# Patient Record
Sex: Female | Born: 1985 | Race: White | Hispanic: No | Marital: Single | Smoking: Never smoker
Health system: Southern US, Community
[De-identification: ages and names within clinical notes are randomized; demographics above are authoritative.]

## PROBLEM LIST (undated history)

## (undated) DIAGNOSIS — M1990 Unspecified osteoarthritis, unspecified site: Secondary | ICD-10-CM

## (undated) DIAGNOSIS — G43909 Migraine, unspecified, not intractable, without status migrainosus: Secondary | ICD-10-CM

## (undated) DIAGNOSIS — T8859XA Other complications of anesthesia, initial encounter: Secondary | ICD-10-CM

## (undated) DIAGNOSIS — N6310 Unspecified lump in the right breast, unspecified quadrant: Secondary | ICD-10-CM

## (undated) DIAGNOSIS — Z8719 Personal history of other diseases of the digestive system: Secondary | ICD-10-CM

## (undated) DIAGNOSIS — T4145XA Adverse effect of unspecified anesthetic, initial encounter: Secondary | ICD-10-CM

## (undated) HISTORY — PX: WISDOM TOOTH EXTRACTION: SHX21

## (undated) HISTORY — PX: BREAST EXCISIONAL BIOPSY: SUR124

---

## 2004-12-16 ENCOUNTER — Other Ambulatory Visit: Admission: RE | Admit: 2004-12-16 | Discharge: 2004-12-16 | Payer: Self-pay | Admitting: Family Medicine

## 2006-02-11 ENCOUNTER — Emergency Department (HOSPITAL_COMMUNITY): Admission: EM | Admit: 2006-02-11 | Discharge: 2006-02-12 | Payer: Self-pay | Admitting: Emergency Medicine

## 2009-12-10 ENCOUNTER — Emergency Department (HOSPITAL_COMMUNITY): Admission: EM | Admit: 2009-12-10 | Discharge: 2009-12-10 | Payer: Self-pay | Admitting: Emergency Medicine

## 2013-12-09 ENCOUNTER — Encounter (HOSPITAL_COMMUNITY): Payer: Self-pay | Admitting: Emergency Medicine

## 2013-12-09 DIAGNOSIS — R1013 Epigastric pain: Secondary | ICD-10-CM | POA: Insufficient documentation

## 2013-12-09 DIAGNOSIS — R0789 Other chest pain: Secondary | ICD-10-CM | POA: Insufficient documentation

## 2013-12-09 DIAGNOSIS — R112 Nausea with vomiting, unspecified: Secondary | ICD-10-CM | POA: Insufficient documentation

## 2013-12-09 DIAGNOSIS — Z87448 Personal history of other diseases of urinary system: Secondary | ICD-10-CM | POA: Insufficient documentation

## 2013-12-09 DIAGNOSIS — Z3202 Encounter for pregnancy test, result negative: Secondary | ICD-10-CM | POA: Insufficient documentation

## 2013-12-09 DIAGNOSIS — Z79899 Other long term (current) drug therapy: Secondary | ICD-10-CM | POA: Insufficient documentation

## 2013-12-09 LAB — PREGNANCY, URINE: Preg Test, Ur: NEGATIVE

## 2013-12-09 NOTE — ED Notes (Signed)
The pt has had vomiting all day  And she has had epigastric pain for 2 weeks and was seen by her doctor who gave her meds for the pain.  She reports that it is chest pain.  The area she points to is the epigastric area.  She thinks she had a gb attack a couple of years ago but has never had any tests.  She was given lopressor and clonazepam  forf tghed pain and she reports that it helps.  lmp  2 days ago

## 2013-12-10 ENCOUNTER — Emergency Department (HOSPITAL_COMMUNITY)
Admission: EM | Admit: 2013-12-10 | Discharge: 2013-12-10 | Disposition: A | Discharge status: Home / Self Care | Payer: Self-pay | Attending: Emergency Medicine | Admitting: Emergency Medicine

## 2013-12-10 DIAGNOSIS — R1013 Epigastric pain: Secondary | ICD-10-CM

## 2013-12-10 DIAGNOSIS — R0789 Other chest pain: Secondary | ICD-10-CM

## 2013-12-10 LAB — COMPREHENSIVE METABOLIC PANEL
ALBUMIN: 3.8 g/dL (ref 3.5–5.2)
ALT: 16 U/L (ref 0–35)
AST: 18 U/L (ref 0–37)
Alkaline Phosphatase: 56 U/L (ref 39–117)
Anion gap: 12 (ref 5–15)
BILIRUBIN TOTAL: 1.3 mg/dL — AB (ref 0.3–1.2)
BUN: 9 mg/dL (ref 6–23)
CHLORIDE: 101 meq/L (ref 96–112)
CO2: 25 mEq/L (ref 19–32)
Calcium: 8.9 mg/dL (ref 8.4–10.5)
Creatinine, Ser: 0.71 mg/dL (ref 0.50–1.10)
GLUCOSE: 137 mg/dL — AB (ref 70–99)
POTASSIUM: 3.5 meq/L — AB (ref 3.7–5.3)
Sodium: 138 mEq/L (ref 137–147)
TOTAL PROTEIN: 7.4 g/dL (ref 6.0–8.3)

## 2013-12-10 LAB — URINALYSIS, ROUTINE W REFLEX MICROSCOPIC
BILIRUBIN URINE: NEGATIVE
Glucose, UA: NEGATIVE mg/dL
HGB URINE DIPSTICK: NEGATIVE
Ketones, ur: NEGATIVE mg/dL
Nitrite: NEGATIVE
Protein, ur: NEGATIVE mg/dL
SPECIFIC GRAVITY, URINE: 1.018 (ref 1.005–1.030)
UROBILINOGEN UA: 1 mg/dL (ref 0.0–1.0)
pH: 5.5 (ref 5.0–8.0)

## 2013-12-10 LAB — CBC WITH DIFFERENTIAL/PLATELET
BASOS ABS: 0.1 10*3/uL (ref 0.0–0.1)
Basophils Relative: 1 % (ref 0–1)
EOS ABS: 0.1 10*3/uL (ref 0.0–0.7)
Eosinophils Relative: 2 % (ref 0–5)
HEMATOCRIT: 39.3 % (ref 36.0–46.0)
Hemoglobin: 14.1 g/dL (ref 12.0–15.0)
LYMPHS PCT: 36 % (ref 12–46)
Lymphs Abs: 2.2 10*3/uL (ref 0.7–4.0)
MCH: 30.1 pg (ref 26.0–34.0)
MCHC: 35.9 g/dL (ref 30.0–36.0)
MCV: 83.8 fL (ref 78.0–100.0)
MONO ABS: 0.5 10*3/uL (ref 0.1–1.0)
MONOS PCT: 8 % (ref 3–12)
NEUTROS PCT: 53 % (ref 43–77)
Neutro Abs: 3.2 10*3/uL (ref 1.7–7.7)
PLATELETS: 365 10*3/uL (ref 150–400)
RBC: 4.69 MIL/uL (ref 3.87–5.11)
RDW: 12.4 % (ref 11.5–15.5)
WBC: 6 10*3/uL (ref 4.0–10.5)

## 2013-12-10 LAB — URINE MICROSCOPIC-ADD ON

## 2013-12-10 LAB — LIPASE, BLOOD: LIPASE: 25 U/L (ref 11–59)

## 2013-12-10 MED ORDER — PANTOPRAZOLE SODIUM 20 MG PO TBEC: 40.0000 mg | DELAYED_RELEASE_TABLET | Freq: Every day | ORAL | Status: DC

## 2013-12-10 MED ORDER — GI COCKTAIL ~~LOC~~
30.0000 mL | Freq: Once | ORAL | Status: AC
  Administered 2013-12-10: 30 mL via ORAL
  Filled 2013-12-10: qty 30

## 2013-12-10 MED ORDER — PANTOPRAZOLE SODIUM 40 MG PO TBEC
40.0000 mg | DELAYED_RELEASE_TABLET | Freq: Once | ORAL | Status: AC
  Administered 2013-12-10: 40 mg via ORAL
  Filled 2013-12-10: qty 1

## 2013-12-10 MED ORDER — ONDANSETRON 4 MG PO TBDP
8.0000 mg | ORAL_TABLET | Freq: Once | ORAL | Status: AC
  Administered 2013-12-10: 8 mg via ORAL
  Filled 2013-12-10: qty 2

## 2013-12-10 MED ORDER — SUCRALFATE 1 G PO TABS
1.0000 g | ORAL_TABLET | Freq: Once | ORAL | Status: AC
Start: 2013-12-10 — End: 2013-12-10
  Administered 2013-12-10: 1 g via ORAL
  Filled 2013-12-10: qty 1

## 2013-12-10 MED ORDER — SUCRALFATE 1 G PO TABS: 1.0000 g | ORAL_TABLET | Freq: Four times a day (QID) | ORAL | Status: DC

## 2013-12-10 NOTE — Discharge Instructions (Signed)

## 2013-12-10 NOTE — ED Provider Notes (Signed)
CSN: [id number]     Arrival date & time 12/09/13  2333 History   First MD Initiated Contact with Patient 12/10/13 [phone]     Chief Complaint  Patient presents with  . Emesis     (Consider location/radiation/quality/duration/timing/severity/associated sxs/prior Treatment) HPI 28 year old female presents to emergency room with complaint of 2 weeks of substernal chest pain, epigastric pain, and nausea and vomiting since 8 PM this evening.  Patient was seen by her primary care doctor roughly a week ago and started on metoprolol and Klonopin.  She reports the Klonopin seems to help with the pain.  The Toprol has helped resolve all of her palpitations.  She reports tonight after eating dinner, symptoms were much worse.  She reports that many years ago she had some right abdominal pain which she attributed to gallbladder, but never had full workup for it.  She denies any fever chills shortness of breath diarrhea or radiation of pain. Past Medical History  Diagnosis Date  . Renal disorder    History reviewed. No pertinent past surgical history. No family history on file. History  Substance Use Topics  . Smoking status: Never Smoker   . Smokeless tobacco: Not on file  . Alcohol Use: No   OB History   Grav Para Term Preterm Abortions TAB SAB Ect Mult Living                 Review of Systems   See History of Present Illness; otherwise all other systems are reviewed and negative  Allergies  Review of patient's allergies indicates no known allergies.  Home Medications   Prior to Admission medications   Medication Sig Start Date End Date Taking? Authorizing Provider  clonazePAM (KLONOPIN) 0.5 MG tablet Take 0.25-0.5 mg by mouth 2 (two) times daily as needed for anxiety.   Yes Historical Provider, MD  metoprolol tartrate (LOPRESSOR) 25 MG tablet Take 25 mg by mouth 2 (two) times daily.   Yes Historical Provider, MD  pantoprazole (PROTONIX) 20 MG tablet Take 2 tablets (40 mg total) by mouth  daily. 12/10/13   Kalman Drape, MD  sucralfate (CARAFATE) 1 G tablet Take 1 tablet (1 g total) by mouth 4 (four) times daily. 12/10/13   Kalman Drape, MD   BP 117/66  Pulse 65  Temp(Src) 98.3 F (36.8 C) (Oral)  Resp 14  SpO2 99%  LMP 12/06/2013 Physical Exam  Nursing note and vitals reviewed. Constitutional: She is oriented to person, place, and time. She appears well-developed and well-nourished. No distress.  HENT:  Head: Normocephalic and atraumatic.  Nose: Nose normal.  Mouth/Throat: Oropharynx is clear and moist.  Eyes: Conjunctivae and EOM are normal. Pupils are equal, round, and reactive to light.  Neck: Normal range of motion. Neck supple. No JVD present. No tracheal deviation present. No thyromegaly present.  Cardiovascular: Normal rate, regular rhythm, normal heart sounds and intact distal pulses.  Exam reveals no gallop and no friction rub.   No murmur heard. Pulmonary/Chest: Effort normal and breath sounds normal. No stridor. No respiratory distress. She has no wheezes. She has no rales. She exhibits no tenderness.  Abdominal: Soft. Bowel sounds are normal. She exhibits no distension and no mass. There is tenderness (epigastric tenderness). There is no rebound and no guarding.  Musculoskeletal: Normal range of motion. She exhibits no edema and no tenderness.  Lymphadenopathy:    She has no cervical adenopathy.  Neurological: She is alert and oriented to person, place, and time. She displays normal  reflexes. She exhibits normal muscle tone. Coordination normal.  Skin: Skin is warm and dry. No rash noted. No erythema. No pallor.  Psychiatric: She has a normal mood and affect. Her behavior is normal. Judgment and thought content normal.    ED Course  Procedures (including critical care time) Labs Review Labs Reviewed  URINALYSIS, ROUTINE W REFLEX MICROSCOPIC - Abnormal; Notable for the following:    Color, Urine AMBER (*)    APPearance CLOUDY (*)    Leukocytes, UA SMALL  (*)    All other components within normal limits  COMPREHENSIVE METABOLIC PANEL - Abnormal; Notable for the following:    Potassium 3.5 (*)    Glucose, Bld 137 (*)    Total Bilirubin 1.3 (*)    All other components within normal limits  URINE MICROSCOPIC-ADD ON - Abnormal; Notable for the following:    Squamous Epithelial / LPF MANY (*)    Bacteria, UA FEW (*)    All other components within normal limits  PREGNANCY, URINE  CBC WITH DIFFERENTIAL  LIPASE, BLOOD    Imaging Review No results found.   EKG Interpretation   Date/Time:  Saturday December 09 2013 23:38:34 EDT Ventricular Rate:  83 PR Interval:  124 QRS Duration: 84 QT Interval:  372 QTC Calculation: 437 R Axis:   82 Text Interpretation:  Normal sinus rhythm Normal ECG Confirmed by Odetta Forness   MD, Kritika Stukes ([id number]) on 12/10/2013 2:40:07 AM      MDM   Final diagnoses:  Epigastric pain  Other chest pain    28 year old female with 2 weeks of chest pain.  EKG without ischemia.  Symptoms seem more likely to be GERD.  Will to a GI cocktail, Protonix and Carafate.   Patient feeling better after medications.  Will discharge home with same.    Kalman Drape, MD 12/10/13 [phone]

## 2014-03-01 ENCOUNTER — Other Ambulatory Visit: Payer: Self-pay | Admitting: Nurse Practitioner

## 2014-03-05 ENCOUNTER — Other Ambulatory Visit (HOSPITAL_COMMUNITY): Payer: Self-pay | Admitting: *Deleted

## 2014-03-05 DIAGNOSIS — N6310 Unspecified lump in the right breast, unspecified quadrant: Secondary | ICD-10-CM

## 2014-03-21 ENCOUNTER — Ambulatory Visit (HOSPITAL_COMMUNITY)
Admission: RE | Admit: 2014-03-21 | Discharge: 2014-03-21 | Disposition: A | Discharge status: Home / Self Care | Payer: Self-pay | Source: Ambulatory Visit | Attending: Obstetrics and Gynecology | Admitting: Obstetrics and Gynecology

## 2014-03-21 ENCOUNTER — Encounter (HOSPITAL_COMMUNITY): Payer: Self-pay

## 2014-03-21 VITALS — BP 112/64 | Temp 98.7°F | Ht 61.0 in | Wt 109.0 lb

## 2014-03-21 DIAGNOSIS — N644 Mastodynia: Secondary | ICD-10-CM

## 2014-03-21 DIAGNOSIS — N6311 Unspecified lump in the right breast, upper outer quadrant: Secondary | ICD-10-CM

## 2014-03-21 DIAGNOSIS — Z1239 Encounter for other screening for malignant neoplasm of breast: Secondary | ICD-10-CM

## 2014-03-21 NOTE — Progress Notes (Signed)
Complaints of right breast lump x 1 year that is painful all the time causing her right arm to go numb. Patient states that the lump has increased in size. Patient rates the pain at a 9 out of 10.  Pap Smear:  Pap smear not completed today. Last Pap smear was 03/01/2014 at Dr. Corky Sing office and normal with HPV negative per patient. Per patient has a history of an abnormal Pap smear 3 years ago that was positive for HPV that a repeat Pap smear was completed for follow-up. Per patient has had at least three normal Pap smears since abnormal Pap smear. No Pap smear results in EPIC.   Physical exam: Breasts Breasts symmetrical. No skin abnormalities bilateral breasts. No nipple retraction bilateral breasts. No nipple discharge bilateral breasts. No lymphadenopathy. No lumps palpated left breast. Palpated a moveable lump within the right breast at 11 o'clock next to areola. Complaints of tenderness when palpated right outer breast that was greater when palpated lump. Referred patient to the [facility] for diagnostic mammogram and right breast ultrasound. Appointment scheduled for Thursday, March 22, 2014 at 0815.    Pelvic/Bimanual No Pap smear completed today since last Pap smear was 03/01/2014. Pap smear not indicated per BCCCP guidelines.

## 2014-03-21 NOTE — Patient Instructions (Signed)
Explained to Dawn Wade that she did not need a Pap smear today due to last Pap smear was 03/01/2014 per patient. Let her know BCCCP will cover Pap smears every 3 years unless has a history of abnormal Pap smears. Referred patient to the [facility] for diagnostic mammogram and right breast ultrasound. Appointment scheduled for Thursday, March 22, 2014 at 0815. Patient aware of appointment and will be there. Dawn Wade verbalized understanding.  Chauna Osoria, Arvil Chaco, RN 5:16 PM

## 2014-03-22 ENCOUNTER — Ambulatory Visit
Admission: RE | Admit: 2014-03-22 | Discharge: 2014-03-22 | Disposition: A | Discharge status: Home / Self Care | Payer: No Typology Code available for payment source | Source: Ambulatory Visit | Attending: Obstetrics and Gynecology | Admitting: Obstetrics and Gynecology

## 2014-03-22 DIAGNOSIS — N6310 Unspecified lump in the right breast, unspecified quadrant: Secondary | ICD-10-CM

## 2014-03-30 DIAGNOSIS — N6310 Unspecified lump in the right breast, unspecified quadrant: Secondary | ICD-10-CM

## 2014-03-30 HISTORY — DX: Unspecified lump in the right breast, unspecified quadrant: N63.10

## 2014-04-02 ENCOUNTER — Other Ambulatory Visit (INDEPENDENT_AMBULATORY_CARE_PROVIDER_SITE_OTHER): Payer: Self-pay | Admitting: Surgery

## 2014-04-06 ENCOUNTER — Encounter (HOSPITAL_BASED_OUTPATIENT_CLINIC_OR_DEPARTMENT_OTHER): Payer: Self-pay | Admitting: *Deleted

## 2014-04-08 NOTE — H&P (Signed)
Dawn Wade 04/02/2014 10:18 AM Location: [facility] Patient #: [id number] DOB: 03-16-1986 Single / Language: Dawn Wade / Race: White Female  History of Present Illness (Dawn Wade; 04/02/2014 10:37 AM) Patient words: fibroadenoma.  The patient is a 29 year old female who presents with a breast mass. She is referred by C.Brannock, RN for evaluation of a right breast mass. The patient reports she has had the mass for approximately a year. She reports that it is slowly getting larger and causing discomfort. She has no previous history of breast masses or need for breast biopsies. She denies nipple discharge. There is no family history of breast cancer. She is otherwise without complaints.   Other Problems Dawn Wade, CMA; 04/02/2014 10:18 AM) Anxiety Disorder Gastroesophageal Reflux Disease Lump In Breast  Past Surgical History Dawn Wade, Dawn Wade; 04/02/2014 10:18 AM) Oral Surgery  Diagnostic Studies History Dawn Wade, CMA; 04/02/2014 10:18 AM) Colonoscopy never Mammogram within last year Pap Smear 1-5 years ago  Allergies Dawn Wade, Dawn Wade; 04/02/2014 10:20 AM) No Known Drug Allergies01/06/2014  Medication History Dawn Wade, CMA; 04/02/2014 10:23 AM) ClonazePAM (0.5MG  Tablet, Oral) Active. Metoprolol Tartrate (25MG  Tablet, Oral) Active. Norgestim-Eth Estrad Triphasic (0.18/0.215/0.25MG -35 MCG Tablet, Oral) Active.  Social History Dawn Wade, CMA; 04/02/2014 10:18 AM) Caffeine use Carbonated beverages, Tea. No alcohol use No drug use Tobacco use Never smoker.  Family History Dawn Wade, [place]; 04/02/2014 10:18 AM) Alcohol Abuse Father. Arthritis Mother. Heart disease in female family member before age 46 Malignant Neoplasm Of Pancreas Father.  Pregnancy / Birth History Dawn Wade, CMA; 04/02/2014 10:18 AM) Age at menarche 25 years. Contraceptive History Oral contraceptives. Gravida 0 Para  0 Regular periods  Review of Systems Dawn Wade CMA; 04/02/2014 10:18 AM) General Not Present- Appetite Loss, Chills, Fatigue, Fever, Night Sweats, Weight Gain and Weight Loss. Skin Not Present- Change in Wart/Mole, Dryness, Hives, Jaundice, New Lesions, Non-Healing Wounds, Rash and Ulcer. HEENT Not Present- Earache, Hearing Loss, Hoarseness, Nose Bleed, Oral Ulcers, Ringing in the Ears, Seasonal Allergies, Sinus Pain, Sore Throat, Visual Disturbances, Wears glasses/contact lenses and Yellow Eyes. Respiratory Not Present- Bloody sputum, Chronic Cough, Difficulty Breathing, Snoring and Wheezing. Breast Present- Breast Mass and Breast Pain. Not Present- Nipple Discharge and Skin Changes. Cardiovascular Not Present- Chest Pain, Difficulty Breathing Lying Down, Leg Cramps, Palpitations, Rapid Heart Rate, Shortness of Breath and Swelling of Extremities. Gastrointestinal Not Present- Abdominal Pain, Bloating, Bloody Stool, Change in Bowel Habits, Chronic diarrhea, Constipation, Difficulty Swallowing, Excessive gas, Gets full quickly at meals, Hemorrhoids, Indigestion, Nausea, Rectal Pain and Vomiting. Female Genitourinary Not Present- Frequency, Nocturia, Painful Urination, Pelvic Pain and Urgency. Neurological Present- Numbness and Tingling. Not Present- Decreased Memory, Fainting, Headaches, Seizures, Tremor, Trouble walking and Weakness. Psychiatric Not Present- Anxiety, Bipolar, Change in Sleep Pattern, Depression, Fearful and Frequent crying. Endocrine Not Present- Cold Intolerance, Excessive Hunger, Hair Changes, Heat Intolerance, Hot flashes and New Diabetes. Hematology Not Present- Easy Bruising, Excessive bleeding, Gland problems, HIV and Persistent Infections.   Vitals Dawn Wade CMA; 04/02/2014 10:25 AM) 04/02/2014 10:23 AM Weight: 105.5 lb Height: 61in Body Surface Area: 1.44 m Body Mass Index: 19.93 kg/m Temp.: 97.34F  Pulse: 66 (Regular)  BP: 102/84 (Sitting, Left  Arm, Standard)    Physical Exam ([name] A. Ninfa Linden Wade; 04/02/2014 10:38 AM) General Mental Status-Alert. General Appearance-Consistent with stated age. Hydration-Well hydrated. Voice-Normal.  Head and Neck Head-normocephalic, atraumatic with no lesions or palpable masses. Trachea-midline. Thyroid Gland Characteristics - normal size and consistency.  Eye Eyeball - Bilateral-Extraocular movements  intact. Sclera/Conjunctiva - Bilateral-No scleral icterus.  Chest and Lung Exam Chest and lung exam reveals -quiet, even and easy respiratory effort with no use of accessory muscles and on auscultation, normal breath sounds, no adventitious sounds and normal vocal resonance. Inspection Chest Wall - Normal. Back - normal.  Breast Breast - Left-Symmetric, Non Tender, No Biopsy scars, no Dimpling, No Inflammation, No Lumpectomy scars, No Mastectomy scars, No Peau d' Orange. Breast - Right-Symmetric, Non Tender, No Biopsy scars, no Dimpling. Note: There is a 2-3 cm mass in the upper outer quadrant of the right breast which is mobile and smooth. Breast Lump-No Palpable Breast Mass.  Cardiovascular Cardiovascular examination reveals -normal heart sounds, regular rate and rhythm with no murmurs and normal pedal pulses bilaterally.  Abdomen Inspection Inspection of the abdomen reveals - No Hernias. Skin - Scar - no surgical scars. Palpation/Percussion Palpation and Percussion of the abdomen reveal - Soft, Non Tender, No Rebound tenderness, No Rigidity (guarding) and No hepatosplenomegaly. Auscultation Auscultation of the abdomen reveals - Bowel sounds normal.  Neurologic Neurologic evaluation reveals -alert and oriented x 3 with no impairment of recent or remote memory. Mental Status-Normal.  Musculoskeletal Normal Exam - Left-Upper Extremity Strength Normal and Lower Extremity Strength Normal. Normal Exam - Right-Upper Extremity Strength Normal and  Lower Extremity Strength Normal.  Lymphatic Head & Neck  General Head & Neck Lymphatics: Bilateral - Description - Normal. Axillary  General Axillary Region: Bilateral - Description - Normal. Tenderness - Non Tender. Femoral & Inguinal - Did not examine.    Assessment & Plan ([name] A. Ninfa Linden Wade; 04/02/2014 10:40 AM) BREAST MASS, RIGHT (048.88  N63) Impression: Patient with a right breast mass. I have reviewed her mammogram and ultrasound. This shows a 2.6 x 3 cm well-circumscribed mass at the 10 o'clock position of the right breast 5 cm from the nipple. It appears consistent with a fibroadenoma.  I discussed this with her in detail. I discussed options which included surgical excision of the mass versus ultrasound-guided biopsy versus close follow-up. Because of her discomfort and increasing size, she wishes to proceed with a lumpectomy. I discussed this with her in detail. I discussed the risks which includes but is not limited to bleeding, infection, recurrence, findings of malignancy, need for further surgery, etc. She understands and wished to proceed with right breast lumpectomy.     Signed by Harl Bowie, Wade (04/02/2014 10:41 AM)

## 2014-04-09 MED ORDER — MIDAZOLAM HCL 2 MG/2ML IJ SOLN
INTRAMUSCULAR | Status: AC
  Filled 2014-04-09: qty 2

## 2014-04-09 MED ORDER — PROPOFOL 10 MG/ML IV BOLUS
INTRAVENOUS | Status: AC
  Filled 2014-04-09: qty 20

## 2014-04-09 MED ORDER — FENTANYL CITRATE 0.05 MG/ML IJ SOLN
INTRAMUSCULAR | Status: AC
  Filled 2014-04-09: qty 4

## 2014-04-17 ENCOUNTER — Encounter (HOSPITAL_COMMUNITY): Payer: Self-pay | Admitting: *Deleted

## 2014-04-17 MED ORDER — FENTANYL CITRATE 0.05 MG/ML IJ SOLN
50.0000 ug | INTRAMUSCULAR | Status: DC | PRN
  Filled 2014-04-17: qty 2

## 2014-04-17 MED ORDER — MIDAZOLAM HCL 2 MG/2ML IJ SOLN
1.0000 mg | INTRAMUSCULAR | Status: DC | PRN
  Filled 2014-04-17: qty 2

## 2014-04-17 MED ORDER — CEFAZOLIN SODIUM-DEXTROSE 2-3 GM-% IV SOLR
2.0000 g | INTRAVENOUS | Status: AC
  Administered 2014-04-18: 2 g via INTRAVENOUS
  Filled 2014-04-17: qty 50

## 2014-04-17 MED ORDER — LACTATED RINGERS IV SOLN
INTRAVENOUS | Status: DC
  Administered 2014-04-18: 12:00:00 via INTRAVENOUS

## 2014-04-17 NOTE — Progress Notes (Signed)
Pt denies SOB, chest pain, and being under the care of a cardiologist. Pt denies having a stress test, echo, and cardiac cath. Pt made aware to stop taking Aspirin, vitamins, and herbal medications. Do not take any NSAIDs ie: Ibuprofen, Advil, Naproxen or any medication containing Aspirin. Pt stated that her BP dropped during wisdom teeth extraction procedure.

## 2014-04-17 NOTE — H&P (Signed)
Dawn Wade 04/02/2014 10:18 AM Location: [facility] Patient #: [id number] DOB: October 01, 1985 Single / Language: Cleophus Molt / Race: White Female  History of Present Illness (Sanya Kobrin A. Ninfa Linden MD; 04/02/2014 10:37 AM) Patient words: fibroadenoma.  The patient is a 29 year old female who presents with a breast mass. She is referred by C.Brannock, RN for evaluation of a right breast mass. The patient reports she has had the mass for approximately a year. She reports that it is slowly getting larger and causing discomfort. She has no previous history of breast masses or need for breast biopsies. She denies nipple discharge. There is no family history of breast cancer. She is otherwise without complaints.   Other Problems [name], CMA; 04/02/2014 10:18 AM) Anxiety Disorder Gastroesophageal Reflux Disease Lump In Breast  Past Surgical History [name], [place]; 04/02/2014 10:18 AM) Oral Surgery  Diagnostic Studies History [name], CMA; 04/02/2014 10:18 AM) Colonoscopy never Mammogram within last year Pap Smear 1-5 years ago  Allergies [name], [place]; 04/02/2014 10:20 AM) No Known Drug Allergies01/06/2014  Medication History [name], CMA; 04/02/2014 10:23 AM) ClonazePAM (0.5MG  Tablet, Oral) Active. Metoprolol Tartrate (25MG  Tablet, Oral) Active. Norgestim-Eth Estrad Triphasic (0.18/0.215/0.25MG -35 MCG Tablet, Oral) Active.  Social History [name], CMA; 04/02/2014 10:18 AM) Caffeine use Carbonated beverages, Tea. No alcohol use No drug use Tobacco use Never smoker.  Family History [name], [place]; 04/02/2014 10:18 AM) Alcohol Abuse Father. Arthritis Mother. Heart disease in female family member before age 74 Malignant Neoplasm Of Pancreas Father.  Pregnancy / Birth History [name], CMA; 04/02/2014 10:18 AM) Age at menarche 72 years. Contraceptive History Oral contraceptives. Gravida 0 Para  0 Regular periods  Review of Systems [name] CMA; 04/02/2014 10:18 AM) General Not Present- Appetite Loss, Chills, Fatigue, Fever, Night Sweats, Weight Gain and Weight Loss. Skin Not Present- Change in Wart/Mole, Dryness, Hives, Jaundice, New Lesions, Non-Healing Wounds, Rash and Ulcer. HEENT Not Present- Earache, Hearing Loss, Hoarseness, Nose Bleed, Oral Ulcers, Ringing in the Ears, Seasonal Allergies, Sinus Pain, Sore Throat, Visual Disturbances, Wears glasses/contact lenses and Yellow Eyes. Respiratory Not Present- Bloody sputum, Chronic Cough, Difficulty Breathing, Snoring and Wheezing. Breast Present- Breast Mass and Breast Pain. Not Present- Nipple Discharge and Skin Changes. Cardiovascular Not Present- Chest Pain, Difficulty Breathing Lying Down, Leg Cramps, Palpitations, Rapid Heart Rate, Shortness of Breath and Swelling of Extremities. Gastrointestinal Not Present- Abdominal Pain, Bloating, Bloody Stool, Change in Bowel Habits, Chronic diarrhea, Constipation, Difficulty Swallowing, Excessive gas, Gets full quickly at meals, Hemorrhoids, Indigestion, Nausea, Rectal Pain and Vomiting. Female Genitourinary Not Present- Frequency, Nocturia, Painful Urination, Pelvic Pain and Urgency. Neurological Present- Numbness and Tingling. Not Present- Decreased Memory, Fainting, Headaches, Seizures, Tremor, Trouble walking and Weakness. Psychiatric Not Present- Anxiety, Bipolar, Change in Sleep Pattern, Depression, Fearful and Frequent crying. Endocrine Not Present- Cold Intolerance, Excessive Hunger, Hair Changes, Heat Intolerance, Hot flashes and New Diabetes. Hematology Not Present- Easy Bruising, Excessive bleeding, Gland problems, HIV and Persistent Infections.   Vitals [name] CMA; 04/02/2014 10:25 AM) 04/02/2014 10:23 AM Weight: 105.5 lb Height: 61in Body Surface Area: 1.44 m Body Mass Index: 19.93 kg/m Temp.: 97.58F  Pulse: 66 (Regular)  BP: 102/84 (Sitting, Left  Arm, Standard)    Physical Exam (Denetra Formoso A. Ninfa Linden MD; 04/02/2014 10:38 AM) General Mental Status-Alert. General Appearance-Consistent with stated age. Hydration-Well hydrated. Voice-Normal.  Head and Neck Head-normocephalic, atraumatic with no lesions or palpable masses. Trachea-midline. Thyroid Gland Characteristics - normal size and consistency.  Eye Eyeball - Bilateral-Extraocular movements  intact. Sclera/Conjunctiva - Bilateral-No scleral icterus.  Chest and Lung Exam Chest and lung exam reveals -quiet, even and easy respiratory effort with no use of accessory muscles and on auscultation, normal breath sounds, no adventitious sounds and normal vocal resonance. Inspection Chest Wall - Normal. Back - normal.  Breast Breast - Left-Symmetric, Non Tender, No Biopsy scars, no Dimpling, No Inflammation, No Lumpectomy scars, No Mastectomy scars, No Peau d' Orange. Breast - Right-Symmetric, Non Tender, No Biopsy scars, no Dimpling. Note: There is a 2-3 cm mass in the upper outer quadrant of the right breast which is mobile and smooth. Breast Lump-No Palpable Breast Mass.  Cardiovascular Cardiovascular examination reveals -normal heart sounds, regular rate and rhythm with no murmurs and normal pedal pulses bilaterally.  Abdomen Inspection Inspection of the abdomen reveals - No Hernias. Skin - Scar - no surgical scars. Palpation/Percussion Palpation and Percussion of the abdomen reveal - Soft, Non Tender, No Rebound tenderness, No Rigidity (guarding) and No hepatosplenomegaly. Auscultation Auscultation of the abdomen reveals - Bowel sounds normal.  Neurologic Neurologic evaluation reveals -alert and oriented x 3 with no impairment of recent or remote memory. Mental Status-Normal.  Musculoskeletal Normal Exam - Left-Upper Extremity Strength Normal and Lower Extremity Strength Normal. Normal Exam - Right-Upper Extremity Strength Normal and  Lower Extremity Strength Normal.  Lymphatic Head & Neck  General Head & Neck Lymphatics: Bilateral - Description - Normal. Axillary  General Axillary Region: Bilateral - Description - Normal. Tenderness - Non Tender. Femoral & Inguinal - Did not examine.    Assessment & Plan (Aalyssa Elderkin A. Ninfa Linden MD; 04/02/2014 10:40 AM) BREAST MASS, RIGHT (323.55  N63) Impression: Patient with a right breast mass. I have reviewed her mammogram and ultrasound. This shows a 2.6 x 3 cm well-circumscribed mass at the 10 o'clock position of the right breast 5 cm from the nipple. It appears consistent with a fibroadenoma.  I discussed this with her in detail. I discussed options which included surgical excision of the mass versus ultrasound-guided biopsy versus close follow-up. Because of her discomfort and increasing size, she wishes to proceed with a lumpectomy. I discussed this with her in detail. I discussed the risks which includes but is not limited to bleeding, infection, recurrence, findings of malignancy, need for further surgery, etc. She understands and wished to proceed with right breast lumpectomy.     Signed by Harl Bowie, MD (04/02/2014 10:41 AM)

## 2014-04-18 ENCOUNTER — Ambulatory Visit (HOSPITAL_COMMUNITY)
Admission: RE | Admit: 2014-04-18 | Discharge: 2014-04-18 | Disposition: A | Discharge status: Home / Self Care | Payer: Self-pay | Source: Ambulatory Visit | Attending: Surgery | Admitting: Surgery

## 2014-04-18 ENCOUNTER — Ambulatory Visit (HOSPITAL_COMMUNITY): Payer: Self-pay | Admitting: Anesthesiology

## 2014-04-18 ENCOUNTER — Encounter (HOSPITAL_COMMUNITY): Payer: Self-pay | Admitting: Surgery

## 2014-04-18 ENCOUNTER — Encounter (HOSPITAL_COMMUNITY)
Admission: RE | Disposition: A | Discharge status: Home / Self Care | Payer: Self-pay | Source: Ambulatory Visit | Attending: Surgery

## 2014-04-18 DIAGNOSIS — D241 Benign neoplasm of right breast: Secondary | ICD-10-CM | POA: Insufficient documentation

## 2014-04-18 DIAGNOSIS — K219 Gastro-esophageal reflux disease without esophagitis: Secondary | ICD-10-CM | POA: Insufficient documentation

## 2014-04-18 DIAGNOSIS — F419 Anxiety disorder, unspecified: Secondary | ICD-10-CM | POA: Insufficient documentation

## 2014-04-18 HISTORY — DX: Personal history of other diseases of the digestive system: Z87.19

## 2014-04-18 HISTORY — PX: BREAST LUMPECTOMY: SHX2

## 2014-04-18 HISTORY — DX: Adverse effect of unspecified anesthetic, initial encounter: T41.45XA

## 2014-04-18 HISTORY — DX: Other complications of anesthesia, initial encounter: T88.59XA

## 2014-04-18 HISTORY — DX: Unspecified lump in the right breast, unspecified quadrant: N63.10

## 2014-04-18 HISTORY — DX: Unspecified osteoarthritis, unspecified site: M19.90

## 2014-04-18 HISTORY — DX: Migraine, unspecified, not intractable, without status migrainosus: G43.909

## 2014-04-18 LAB — HCG, SERUM, QUALITATIVE: PREG SERUM: NEGATIVE

## 2014-04-18 LAB — CBC
HEMATOCRIT: 40.8 % (ref 36.0–46.0)
HEMOGLOBIN: 14.4 g/dL (ref 12.0–15.0)
MCH: 29.8 pg (ref 26.0–34.0)
MCHC: 35.3 g/dL (ref 30.0–36.0)
MCV: 84.3 fL (ref 78.0–100.0)
PLATELETS: 380 10*3/uL (ref 150–400)
RBC: 4.84 MIL/uL (ref 3.87–5.11)
RDW: 13.1 % (ref 11.5–15.5)
WBC: 7.1 10*3/uL (ref 4.0–10.5)

## 2014-04-18 SURGERY — BREAST LUMPECTOMY
Anesthesia: General | Site: Breast | Laterality: Right

## 2014-04-18 MED ORDER — SCOPOLAMINE 1 MG/3DAYS TD PT72: 1.0000 | MEDICATED_PATCH | TRANSDERMAL | Status: DC

## 2014-04-18 MED ORDER — PROPOFOL 10 MG/ML IV BOLUS
INTRAVENOUS | Status: DC | PRN
  Administered 2014-04-18: 150 mg via INTRAVENOUS

## 2014-04-18 MED ORDER — ONDANSETRON HCL 4 MG/2ML IJ SOLN
INTRAMUSCULAR | Status: AC
  Filled 2014-04-18: qty 2

## 2014-04-18 MED ORDER — HYDROMORPHONE HCL 1 MG/ML IJ SOLN
0.2500 mg | INTRAMUSCULAR | Status: DC | PRN
  Administered 2014-04-18: 0.5 mg via INTRAVENOUS

## 2014-04-18 MED ORDER — ACETAMINOPHEN 325 MG PO TABS: 650.0000 mg | ORAL_TABLET | ORAL | Status: DC | PRN

## 2014-04-18 MED ORDER — MIDAZOLAM HCL 2 MG/2ML IJ SOLN
INTRAMUSCULAR | Status: AC
  Filled 2014-04-18: qty 2

## 2014-04-18 MED ORDER — MORPHINE SULFATE 2 MG/ML IJ SOLN: 1.0000 mg | INTRAMUSCULAR | Status: DC | PRN

## 2014-04-18 MED ORDER — OXYCODONE HCL 5 MG/5ML PO SOLN: 5.0000 mg | Freq: Once | ORAL | Status: AC | PRN

## 2014-04-18 MED ORDER — OXYCODONE HCL 5 MG PO TABS
ORAL_TABLET | ORAL | Status: AC
  Filled 2014-04-18: qty 1

## 2014-04-18 MED ORDER — FENTANYL CITRATE 0.05 MG/ML IJ SOLN
INTRAMUSCULAR | Status: DC | PRN
  Administered 2014-04-18: 50 ug via INTRAVENOUS

## 2014-04-18 MED ORDER — LIDOCAINE HCL (CARDIAC) 20 MG/ML IV SOLN
INTRAVENOUS | Status: DC | PRN
  Administered 2014-04-18: 50 mg via INTRAVENOUS

## 2014-04-18 MED ORDER — OXYCODONE HCL 5 MG PO TABS
5.0000 mg | ORAL_TABLET | Freq: Once | ORAL | Status: AC | PRN
  Administered 2014-04-18: 5 mg via ORAL

## 2014-04-18 MED ORDER — FENTANYL CITRATE 0.05 MG/ML IJ SOLN
INTRAMUSCULAR | Status: AC
  Filled 2014-04-18: qty 5

## 2014-04-18 MED ORDER — KETOROLAC TROMETHAMINE 30 MG/ML IJ SOLN
INTRAMUSCULAR | Status: AC
  Filled 2014-04-18: qty 1

## 2014-04-18 MED ORDER — ACETAMINOPHEN 650 MG RE SUPP: 650.0000 mg | RECTAL | Status: DC | PRN

## 2014-04-18 MED ORDER — BUPIVACAINE-EPINEPHRINE 0.5% -1:200000 IJ SOLN
INTRAMUSCULAR | Status: DC | PRN
  Administered 2014-04-18: 20 mL

## 2014-04-18 MED ORDER — 0.9 % SODIUM CHLORIDE (POUR BTL) OPTIME
TOPICAL | Status: DC | PRN
  Administered 2014-04-18: 1000 mL

## 2014-04-18 MED ORDER — KETOROLAC TROMETHAMINE 30 MG/ML IJ SOLN
INTRAMUSCULAR | Status: DC | PRN
  Administered 2014-04-18: 30 mg via INTRAVENOUS

## 2014-04-18 MED ORDER — ONDANSETRON HCL 4 MG/2ML IJ SOLN
INTRAMUSCULAR | Status: DC | PRN
  Administered 2014-04-18: 4 mg via INTRAVENOUS

## 2014-04-18 MED ORDER — MIDAZOLAM HCL 5 MG/5ML IJ SOLN
INTRAMUSCULAR | Status: DC | PRN
  Administered 2014-04-18: 2 mg via INTRAVENOUS

## 2014-04-18 MED ORDER — PROPOFOL 10 MG/ML IV BOLUS
INTRAVENOUS | Status: AC
  Filled 2014-04-18: qty 20

## 2014-04-18 MED ORDER — SODIUM CHLORIDE 0.9 % IJ SOLN: 3.0000 mL | INTRAMUSCULAR | Status: DC | PRN

## 2014-04-18 MED ORDER — SODIUM CHLORIDE 0.9 % IJ SOLN: 3.0000 mL | Freq: Two times a day (BID) | INTRAMUSCULAR | Status: DC

## 2014-04-18 MED ORDER — SODIUM CHLORIDE 0.9 % IV SOLN: 250.0000 mL | INTRAVENOUS | Status: DC | PRN

## 2014-04-18 MED ORDER — SCOPOLAMINE 1 MG/3DAYS TD PT72
MEDICATED_PATCH | TRANSDERMAL | Status: AC
  Administered 2014-04-18: 1.5 mg
  Filled 2014-04-18: qty 1

## 2014-04-18 MED ORDER — ONDANSETRON HCL 4 MG/2ML IJ SOLN: 4.0000 mg | Freq: Once | INTRAMUSCULAR | Status: DC | PRN

## 2014-04-18 MED ORDER — OXYCODONE HCL 5 MG PO TABS: 5.0000 mg | ORAL_TABLET | ORAL | Status: DC | PRN

## 2014-04-18 MED ORDER — HYDROCODONE-ACETAMINOPHEN 5-325 MG PO TABS: 1.0000 | ORAL_TABLET | Freq: Four times a day (QID) | ORAL | Status: DC | PRN

## 2014-04-18 MED ORDER — BUPIVACAINE-EPINEPHRINE (PF) 0.5% -1:200000 IJ SOLN
INTRAMUSCULAR | Status: AC
  Filled 2014-04-18: qty 30

## 2014-04-18 MED ORDER — HYDROMORPHONE HCL 1 MG/ML IJ SOLN
INTRAMUSCULAR | Status: AC
  Filled 2014-04-18: qty 1

## 2014-04-18 SURGICAL SUPPLY — 58 items
APL SKNCLS STERI-STRIP NONHPOA (GAUZE/BANDAGES/DRESSINGS) ×1
BENZOIN TINCTURE PRP APPL 2/3 (GAUZE/BANDAGES/DRESSINGS) ×3 IMPLANT
BLADE HEX COATED 2.75 (ELECTRODE) ×1 IMPLANT
BLADE SURG 10 STRL SS (BLADE) ×2 IMPLANT
BLADE SURG 15 STRL LF DISP TIS (BLADE) ×1 IMPLANT
BLADE SURG 15 STRL SS (BLADE) ×6
CANISTER SUCT 1200ML W/VALVE (MISCELLANEOUS) IMPLANT
CANISTER SUCT 3000ML (MISCELLANEOUS) ×2 IMPLANT
CHLORAPREP W/TINT 26ML (MISCELLANEOUS) ×3 IMPLANT
CLIP TI WIDE RED SMALL 6 (CLIP) IMPLANT
CLOSURE WOUND 1/2 X4 (GAUZE/BANDAGES/DRESSINGS)
CONT SPEC 4OZ CLIKSEAL STRL BL (MISCELLANEOUS) ×2 IMPLANT
COVER BACK TABLE 60X90IN (DRAPES) ×1 IMPLANT
COVER MAYO STAND STRL (DRAPES) ×1 IMPLANT
COVER SURGICAL LIGHT HANDLE (MISCELLANEOUS) ×2 IMPLANT
DECANTER SPIKE VIAL GLASS SM (MISCELLANEOUS) ×2 IMPLANT
DEVICE DUBIN W/COMP PLATE 8390 (MISCELLANEOUS) IMPLANT
DRAPE LAPAROTOMY 100X72 PEDS (DRAPES) ×3 IMPLANT
DRAPE UTILITY XL STRL (DRAPES) ×3 IMPLANT
DRSG TEGADERM 4X4.75 (GAUZE/BANDAGES/DRESSINGS) ×3 IMPLANT
ELECT CAUTERY BLADE 6.4 (BLADE) ×2 IMPLANT
ELECT REM PT RETURN 9FT ADLT (ELECTROSURGICAL) ×3
ELECTRODE REM PT RTRN 9FT ADLT (ELECTROSURGICAL) ×1 IMPLANT
GLOVE BIOGEL PI IND STRL 7.0 (GLOVE) IMPLANT
GLOVE BIOGEL PI INDICATOR 7.0 (GLOVE) ×2
GLOVE SURG SIGNA 7.5 PF LTX (GLOVE) ×1 IMPLANT
GLOVE SURG SS PI 7.0 STRL IVOR (GLOVE) ×2 IMPLANT
GLOVE SURG SS PI 7.5 STRL IVOR (GLOVE) ×2 IMPLANT
GOWN STRL REUS W/ TWL LRG LVL3 (GOWN DISPOSABLE) ×1 IMPLANT
GOWN STRL REUS W/ TWL XL LVL3 (GOWN DISPOSABLE) ×1 IMPLANT
GOWN STRL REUS W/TWL LRG LVL3 (GOWN DISPOSABLE) ×3
GOWN STRL REUS W/TWL XL LVL3 (GOWN DISPOSABLE) ×3
KIT BASIN OR (CUSTOM PROCEDURE TRAY) ×2 IMPLANT
KIT MARKER MARGIN INK (KITS) ×1 IMPLANT
LIQUID BAND (GAUZE/BANDAGES/DRESSINGS) ×2 IMPLANT
NDL HYPO 25X1 1.5 SAFETY (NEEDLE) ×1 IMPLANT
NEEDLE HYPO 25X1 1.5 SAFETY (NEEDLE) IMPLANT
NS IRRIG 1000ML POUR BTL (IV SOLUTION) ×3 IMPLANT
PACK BASIN DAY SURGERY FS (CUSTOM PROCEDURE TRAY) ×1 IMPLANT
PACK SURGICAL SETUP 50X90 (CUSTOM PROCEDURE TRAY) ×2 IMPLANT
PENCIL BUTTON BLDE SNGL 10FT (ELECTRODE) ×2 IMPLANT
PENCIL BUTTON HOLSTER BLD 10FT (ELECTRODE) ×3 IMPLANT
SLEEVE SCD COMPRESS KNEE MED (MISCELLANEOUS) IMPLANT
SPONGE GAUZE 4X4 12PLY STER LF (GAUZE/BANDAGES/DRESSINGS) ×1 IMPLANT
SPONGE LAP 4X18 X RAY DECT (DISPOSABLE) ×1 IMPLANT
STRIP CLOSURE SKIN 1/2X4 (GAUZE/BANDAGES/DRESSINGS) ×1 IMPLANT
SUT MNCRL AB 4-0 PS2 18 (SUTURE) ×3 IMPLANT
SUT SILK 2 0 SH (SUTURE) ×1 IMPLANT
SUT VIC AB 3-0 SH 27 (SUTURE) ×3
SUT VIC AB 3-0 SH 27X BRD (SUTURE) ×1 IMPLANT
SYR CONTROL 10ML LL (SYRINGE) ×3 IMPLANT
TOWEL OR 17X24 6PK STRL BLUE (TOWEL DISPOSABLE) ×1 IMPLANT
TOWEL OR NON WOVEN STRL DISP B (DISPOSABLE) ×3 IMPLANT
TUBE CONNECTING 12'X1/4 (SUCTIONS) ×1
TUBE CONNECTING 12X1/4 (SUCTIONS) ×1 IMPLANT
TUBE CONNECTING 20'X1/4 (TUBING)
TUBE CONNECTING 20X1/4 (TUBING) IMPLANT
YANKAUER SUCT BULB TIP NO VENT (SUCTIONS) ×2 IMPLANT

## 2014-04-18 NOTE — Anesthesia Procedure Notes (Signed)
Procedure Name: LMA Insertion Date/Time: 04/18/2014 1:08 PM Performed by: Rush Farmer E Pre-anesthesia Checklist: Patient identified, Emergency Drugs available, Suction available, Patient being monitored and Timeout performed Patient Re-evaluated:Patient Re-evaluated prior to inductionOxygen Delivery Method: Circle system utilized Preoxygenation: Pre-oxygenation with 100% oxygen Intubation Type: IV induction Ventilation: Mask ventilation without difficulty LMA: LMA inserted LMA Size: 4.0 Number of attempts: 1 Airway Equipment and Method: Stylet Placement Confirmation: positive ETCO2 Tube secured with: Tape Dental Injury: Teeth and Oropharynx as per pre-operative assessment

## 2014-04-18 NOTE — Op Note (Signed)
Dawn Wade, Dawn Wade               ACCOUNT NO.:  [id number]  MEDICAL RECORD NO.:  [id number]  LOCATION:  [facility]                         FACILITY:  [facility]  PHYSICIAN:  Coralie Keens, M.D. DATE OF BIRTH:  [birth year]  DATE OF PROCEDURE:  04/18/2014 DATE OF DISCHARGE:  04/18/2014                              OPERATIVE REPORT   PREOPERATIVE DIAGNOSIS:  Right breast mass.  POSTOPERATIVE DIAGNOSIS:  Right breast mass.  PROCEDURE:  Right breast lumpectomy.  SURGEON:  Coralie Keens, M.D.  ANESTHESIA:  General and 0.25% Marcaine.  ESTIMATED BLOOD LOSS:  Minimal.  INDICATIONS:  This is a 29 year old female who presents with a mass which is slowly enlarging in right breast.  It appears on ultrasound consistent with a possible fibroadenoma.  Decision then made to proceed with right breast lumpectomy.  PROCEDURE IN DETAIL:  The patient was brought to the operating room, identified as Dawn Wade, she was placed supine on the operating table and general anesthesia induced.  Her right breast was then prepped and draped in usual sterile fashion.  I anesthetized the skin around the lateral edge of the right areola with Marcaine.  I then made a circumareolar incision with a scalpel.  I took this down to the breast tissue with electrocautery.  I was then able to push the mass into my incision and excised in its entirety with electrocautery.  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  It was sent to Pathology for evaluation.  I then achieved hemostasis with cautery.  I anesthetized the wound further with Marcaine.  I then closed subcutaneous tissue with interrupted 3-0 Vicryl sutures and closed the skin with running 4-0 Monocryl.  Skin glue was then applied.  The patient tolerated the procedure well.  All the counts were correct at the end of the procedure.  The patient was then extubated in the operating room and taken in stable condition to recovery room.     Coralie Keens, M.D.     DB/MEDQ  D:  04/18/2014  T:  04/18/2014  Job:  [id number]

## 2014-04-18 NOTE — Op Note (Signed)
RIGHT BREAST LUMPECTOMY  Procedure Note  [name] 04/18/2014   Pre-op Diagnosis: Right Breast Mass     Post-op Diagnosis: same  Procedure(s): RIGHT BREAST LUMPECTOMY  Surgeon(s): Coralie Keens, MD  Anesthesia: Choice  Staff:  Circulator: Tomma Rakers Sipsis, RN Scrub Person: Leslie Andrea, Pierre Part Circulator Assistant: Cyd Silence, RN  Estimated Blood Loss: Minimal               Specimens:sent to path          [facility]   Date: 04/18/2014  Time: 1:34 PM

## 2014-04-18 NOTE — Interval H&P Note (Signed)
History and Physical Interval Note: no change in H and P  04/18/2014 12:51 PM  Dawn Wade  has presented today for surgery, with the diagnosis of Right Breast Mass  The various methods of treatment have been discussed with the patient and family. After consideration of risks, benefits and other options for treatment, the patient has consented to  Procedure(s): RIGHT BREAST LUMPECTOMY (Right) as a surgical intervention .  The patient's history has been reviewed, patient examined, no change in status, stable for surgery.  I have reviewed the patient's chart and labs.  Questions were answered to the patient's satisfaction.     [name]

## 2014-04-18 NOTE — Anesthesia Postprocedure Evaluation (Signed)
  Anesthesia Post-op Note  Patient: Dawn Wade  Procedure(s) Performed: Procedure(s): RIGHT BREAST LUMPECTOMY (Right)  Patient Location: PACU  Anesthesia Type: General   Level of Consciousness: awake, alert  and oriented  Airway and Oxygen Therapy: Patient Spontanous Breathing  Post-op Pain: mild  Post-op Assessment: Post-op Vital signs reviewed  Post-op Vital Signs: Reviewed  Last Vitals:  Filed Vitals:   04/18/14 1400  BP: 113/60  Pulse:   Temp:   Resp:     Complications: No apparent anesthesia complications

## 2014-04-18 NOTE — Discharge Instructions (Signed)
[facility] °Office Phone Number [phone] ° °BREAST BIOPSY/ PARTIAL MASTECTOMY: POST OP INSTRUCTIONS ° °Always review your discharge instruction sheet given to you by the facility where your surgery was performed. ° °IF YOU HAVE DISABILITY OR FAMILY LEAVE FORMS, YOU MUST BRING THEM TO THE OFFICE FOR PROCESSING.  DO NOT GIVE THEM TO YOUR DOCTOR. ° °1. A prescription for pain medication may be given to you upon discharge.  Take your pain medication as prescribed, if needed.  If narcotic pain medicine is not needed, then you may take acetaminophen (Tylenol) or ibuprofen (Advil) as needed. °2. Take your usually prescribed medications unless otherwise directed °3. If you need a refill on your pain medication, please contact your pharmacy.  They will contact our office to request authorization.  Prescriptions will not be filled after 5pm or on week-ends. °4. You should eat very light the first 24 hours after surgery, such as soup, crackers, pudding, etc.  Resume your normal diet the day after surgery. °5. Most patients will experience some swelling and bruising in the breast.  Ice packs and a good support bra will help.  Swelling and bruising can take several days to resolve.  °6. It is common to experience some constipation if taking pain medication after surgery.  Increasing fluid intake and taking a stool softener will usually help or prevent this problem from occurring.  A mild laxative (Milk of Magnesia or Miralax) should be taken according to package directions if there are no bowel movements after 48 hours. °7. Unless discharge instructions indicate otherwise, you may remove your bandages 24-48 hours after surgery, and you may shower at that time.  You may have steri-strips (small skin tapes) in place directly over the incision.  These strips should be left on the skin for 7-10 days.  If your surgeon used skin glue on the incision, you may shower in 24 hours.  The glue will flake off over the  next 2-3 weeks.  Any sutures or staples will be removed at the office during your follow-up visit. °8. ACTIVITIES:  You may resume regular daily activities (gradually increasing) beginning the next day.  Wearing a good support bra or sports bra minimizes pain and swelling.  You may have sexual intercourse when it is comfortable. °a. You may drive when you no longer are taking prescription pain medication, you can comfortably wear a seatbelt, and you can safely maneuver your car and apply brakes. °b. RETURN TO WORK:  ______________________________________________________________________________________ °9. You should see your doctor in the office for a follow-up appointment approximately two weeks after your surgery.  Your doctor’s nurse will typically make your follow-up appointment when she calls you with your pathology report.  Expect your pathology report 2-3 business days after your surgery.  You may call to check if you do not hear from us after three days. °10. OTHER INSTRUCTIONS: _______________________________________________________________________________________________ _____________________________________________________________________________________________________________________________________ °_____________________________________________________________________________________________________________________________________ °_____________________________________________________________________________________________________________________________________ ° °WHEN TO CALL YOUR DOCTOR: °1. Fever over 101.0 °2. Nausea and/or vomiting. °3. Extreme swelling or bruising. °4. Continued bleeding from incision. °5. Increased pain, redness, or drainage from the incision. ° °The clinic staff is available to answer your questions during regular business hours.  Please don’t hesitate to call and ask to speak to one of the nurses for clinical concerns.  If you have a medical emergency, go to the nearest  emergency room or call 911.  A surgeon from [facility] is always on call at the hospital. ° °For further questions, please visit [email or web address]  °

## 2014-04-18 NOTE — Anesthesia Preprocedure Evaluation (Signed)
Anesthesia Evaluation  Patient identified by MRN, date of birth, ID band Patient awake    Reviewed: Allergy & Precautions, NPO status , Patient's Chart, lab work & pertinent test results  History of Anesthesia Complications Negative for: history of anesthetic complications  Airway Mallampati: I  TM Distance: >3 FB Neck ROM: Full    Dental  (+) Teeth Intact, Dental Advisory Given   Pulmonary  breath sounds clear to auscultation        Cardiovascular Rhythm:Regular Rate:Normal     Neuro/Psych    GI/Hepatic   Endo/Other    Renal/GU      Musculoskeletal   Abdominal   Peds  Hematology   Anesthesia Other Findings   Reproductive/Obstetrics                             Anesthesia Physical Anesthesia Plan  ASA: I  Anesthesia Plan: General   Post-op Pain Management:    Induction: Intravenous  Airway Management Planned: LMA  Additional Equipment:   Intra-op Plan:   Post-operative Plan: Extubation in OR  Informed Consent: I have reviewed the patients History and Physical, chart, labs and discussed the procedure including the risks, benefits and alternatives for the proposed anesthesia with the patient or authorized representative who has indicated his/her understanding and acceptance.   Dental advisory given  Plan Discussed with: CRNA, Anesthesiologist and Surgeon  Anesthesia Plan Comments:         Anesthesia Quick Evaluation

## 2014-04-18 NOTE — Transfer of Care (Signed)
Immediate Anesthesia Transfer of Care Note  Patient: Dawn Wade  Procedure(s) Performed: Procedure(s): RIGHT BREAST LUMPECTOMY (Right)  Patient Location: PACU  Anesthesia Type:General  Level of Consciousness: awake, alert  and oriented  Airway & Oxygen Therapy: Patient Spontanous Breathing and Patient connected to nasal cannula oxygen  Post-op Assessment: Report given to PACU RN, Post -op Vital signs reviewed and stable and Patient moving all extremities X 4  Post vital signs: Reviewed and stable  Complications: No apparent anesthesia complications

## 2014-04-19 ENCOUNTER — Encounter (HOSPITAL_COMMUNITY): Payer: Self-pay | Admitting: Surgery

## 2014-12-31 ENCOUNTER — Emergency Department (HOSPITAL_COMMUNITY)
Admission: EM | Admit: 2014-12-31 | Discharge: 2014-12-31 | Disposition: A | Discharge status: Home / Self Care | Payer: No Typology Code available for payment source | Attending: Emergency Medicine | Admitting: Emergency Medicine

## 2014-12-31 ENCOUNTER — Encounter (HOSPITAL_COMMUNITY): Payer: Self-pay | Admitting: Emergency Medicine

## 2014-12-31 DIAGNOSIS — Y998 Other external cause status: Secondary | ICD-10-CM | POA: Diagnosis not present

## 2014-12-31 DIAGNOSIS — Y92410 Unspecified street and highway as the place of occurrence of the external cause: Secondary | ICD-10-CM | POA: Diagnosis not present

## 2014-12-31 DIAGNOSIS — M1711 Unilateral primary osteoarthritis, right knee: Secondary | ICD-10-CM | POA: Insufficient documentation

## 2014-12-31 DIAGNOSIS — Z8742 Personal history of other diseases of the female genital tract: Secondary | ICD-10-CM | POA: Insufficient documentation

## 2014-12-31 DIAGNOSIS — Z8719 Personal history of other diseases of the digestive system: Secondary | ICD-10-CM | POA: Insufficient documentation

## 2014-12-31 DIAGNOSIS — Z8679 Personal history of other diseases of the circulatory system: Secondary | ICD-10-CM | POA: Insufficient documentation

## 2014-12-31 DIAGNOSIS — S39012A Strain of muscle, fascia and tendon of lower back, initial encounter: Secondary | ICD-10-CM | POA: Diagnosis not present

## 2014-12-31 DIAGNOSIS — Y9389 Activity, other specified: Secondary | ICD-10-CM | POA: Diagnosis not present

## 2014-12-31 DIAGNOSIS — Z91040 Latex allergy status: Secondary | ICD-10-CM | POA: Diagnosis not present

## 2014-12-31 DIAGNOSIS — S3992XA Unspecified injury of lower back, initial encounter: Secondary | ICD-10-CM | POA: Diagnosis present

## 2014-12-31 MED ORDER — HYDROCODONE-ACETAMINOPHEN 5-325 MG PO TABS: 1.0000 | ORAL_TABLET | Freq: Four times a day (QID) | ORAL | Status: AC | PRN

## 2014-12-31 MED ORDER — BACITRACIN ZINC 500 UNIT/GM EX OINT
TOPICAL_OINTMENT | CUTANEOUS | Status: AC
  Administered 2014-12-31: 11:00:00
  Filled 2014-12-31: qty 0.9

## 2014-12-31 NOTE — Discharge Instructions (Signed)
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
## 2017-10-22 ENCOUNTER — Emergency Department (HOSPITAL_COMMUNITY): Payer: Self-pay

## 2017-10-22 ENCOUNTER — Encounter (HOSPITAL_COMMUNITY): Payer: Self-pay | Admitting: Emergency Medicine

## 2017-10-22 ENCOUNTER — Emergency Department (HOSPITAL_COMMUNITY)
Admission: EM | Admit: 2017-10-22 | Discharge: 2017-10-22 | Disposition: A | Discharge status: Home / Self Care | Payer: Self-pay | Attending: Emergency Medicine | Admitting: Emergency Medicine

## 2017-10-22 ENCOUNTER — Other Ambulatory Visit: Payer: Self-pay

## 2017-10-22 DIAGNOSIS — Z79899 Other long term (current) drug therapy: Secondary | ICD-10-CM | POA: Insufficient documentation

## 2017-10-22 DIAGNOSIS — R102 Pelvic and perineal pain: Secondary | ICD-10-CM | POA: Insufficient documentation

## 2017-10-22 DIAGNOSIS — Z91040 Latex allergy status: Secondary | ICD-10-CM | POA: Insufficient documentation

## 2017-10-22 LAB — URINALYSIS, MICROSCOPIC (REFLEX): RBC / HPF: >50 RBC/hpf (ref 0–5)

## 2017-10-22 LAB — CBC WITH DIFFERENTIAL/PLATELET
Abs Immature Granulocytes: 0.1 10*3/uL (ref 0.0–0.1)
Basophils Absolute: 0.1 10*3/uL (ref 0.0–0.1)
Basophils Relative: 1 %
EOS PCT: 1 %
Eosinophils Absolute: 0.1 10*3/uL (ref 0.0–0.7)
HEMATOCRIT: 43.7 % (ref 36.0–46.0)
HEMOGLOBIN: 14.1 g/dL (ref 12.0–15.0)
IMMATURE GRANULOCYTES: 1 %
LYMPHS PCT: 13 %
Lymphs Abs: 1.5 10*3/uL (ref 0.7–4.0)
MCH: 29.2 pg (ref 26.0–34.0)
MCHC: 32.3 g/dL (ref 30.0–36.0)
MCV: 90.5 fL (ref 78.0–100.0)
Monocytes Absolute: 0.6 10*3/uL (ref 0.1–1.0)
Monocytes Relative: 5 %
NEUTROS PCT: 79 %
Neutro Abs: 9.7 10*3/uL — ABNORMAL HIGH (ref 1.7–7.7)
Platelets: 371 10*3/uL (ref 150–400)
RBC: 4.83 MIL/uL (ref 3.87–5.11)
RDW: 13 % (ref 11.5–15.5)
WBC: 12.2 10*3/uL — ABNORMAL HIGH (ref 4.0–10.5)

## 2017-10-22 LAB — URINALYSIS, ROUTINE W REFLEX MICROSCOPIC
GLUCOSE, UA: NEGATIVE mg/dL
Ketones, ur: 15 mg/dL — AB
Leukocytes, UA: NEGATIVE
Nitrite: NEGATIVE
PROTEIN: 100 mg/dL — AB
Specific Gravity, Urine: 1.025 (ref 1.005–1.030)
pH: 6.5 (ref 5.0–8.0)

## 2017-10-22 LAB — I-STAT CHEM 8, ED
BUN: 9 mg/dL (ref 6–20)
Calcium, Ion: 1.11 mmol/L — ABNORMAL LOW (ref 1.15–1.40)
Chloride: 108 mmol/L (ref 98–111)
Creatinine, Ser: 0.8 mg/dL (ref 0.44–1.00)
GLUCOSE: 164 mg/dL — AB (ref 70–99)
HCT: 40 % (ref 36.0–46.0)
Hemoglobin: 13.6 g/dL (ref 12.0–15.0)
Potassium: 4.1 mmol/L (ref 3.5–5.1)
Sodium: 142 mmol/L (ref 135–145)
TCO2: 21 mmol/L — ABNORMAL LOW (ref 22–32)

## 2017-10-22 LAB — I-STAT BETA HCG BLOOD, ED (MC, WL, AP ONLY): I-stat hCG, quantitative: <5 m[IU]/mL (ref <5)

## 2017-10-22 MED ORDER — MELOXICAM 7.5 MG PO TABS: 15.0000 mg | ORAL_TABLET | Freq: Every day | ORAL | 0 refills | Status: AC

## 2017-10-22 MED ORDER — KETOROLAC TROMETHAMINE 30 MG/ML IJ SOLN
30.0000 mg | Freq: Once | INTRAMUSCULAR | Status: AC
  Administered 2017-10-22: 30 mg via INTRAMUSCULAR
  Filled 2017-10-22: qty 1

## 2017-10-22 MED ORDER — KETOROLAC TROMETHAMINE 30 MG/ML IJ SOLN: 30.0000 mg | Freq: Once | INTRAMUSCULAR | Status: DC

## 2017-10-22 NOTE — ED Triage Notes (Signed)
Patient presents to the ED with complaints of abdominal cramping and abdominal pai. Patient reports pain started last night associated with vomiting. Patient reports start menstrual cycle last night. Patient reports she has painful cycle with vomiting but the pain is never this bad. Patient alert and oriented.

## 2017-10-22 NOTE — ED Notes (Signed)
22G PIV removed from R hand

## 2017-10-22 NOTE — Discharge Instructions (Signed)
Mobic twice daily for pain ER for worsening symptoms

## 2017-10-22 NOTE — ED Provider Notes (Signed)
[facility] EMERGENCY DEPARTMENT Provider Note   CSN: [id number] Arrival date & time: 10/22/17  7341     History   Chief Complaint Chief Complaint  Patient presents with  . Abdominal Cramping  . Abdominal Pain    HPI Dawn Wade is a 32 y.o. female.  HPI  The patient is a 32 year old female who has a history of migraine headaches, esophageal reflux and states that she usually has cramping and bleeding with her menstrual cycles but when she started her menstrual cycle last night and through this morning she has had quite severe pain mostly in the left lower quadrant, this is cramping, intense and has been persistent throughout the evening.  She has had associated nausea and vomiting which is unusual for her but has had no fevers.  She denies swelling of the legs, rashes of the skin, coughing, shortness of breath, numbness, tingling, headache.  She denies being pregnant, denies any pain with sexual activity, denies any vaginal discharge.  She has had no urinary symptoms including dysuria, frequency, hematuria.  She did take a muscle relaxer that her mother gave her last night but states it did not help, she has not had any anti-inflammatories.  Past Medical History:  Diagnosis Date  . Arthritis    right knee  . Breast mass, right 03/2014  . Complication of anesthesia    transient hypotension  . History of esophageal reflux    no current problems  . Migraines     There are no active problems to display for this patient.   Past Surgical History:  Procedure Laterality Date  . BREAST LUMPECTOMY Right 04/18/2014   Procedure: RIGHT BREAST LUMPECTOMY;  Surgeon: Coralie Keens, MD;  Location: [facility];  Service: General;  Laterality: Right;  . WISDOM TOOTH EXTRACTION       OB History    Gravida  0   Para  0   Term  0   Preterm  0   AB  0   Living  0     SAB  0   TAB  0   Ectopic  0   Multiple  0   Live Births               Home  Medications    Prior to Admission medications   Medication Sig Start Date End Date Taking? Authorizing Provider  Norgestim-Eth Estrad Triphasic (ORTHO TRI-CYCLEN, 28, PO) Take 1 tablet by mouth daily.    Yes [provider]  HYDROcodone-acetaminophen (NORCO/VICODIN) 5-325 MG tablet Take 1-2 tablets by mouth every 6 (six) hours as needed. Patient not taking: Reported on 10/22/2017 12/31/14   Montine Circle, PA-C  meloxicam (MOBIC) 7.5 MG tablet Take 2 tablets (15 mg total) by mouth daily. 10/22/17   Noemi Chapel, MD    Family History Family History  Problem Relation Age of Onset  . Diabetes Maternal Grandmother     Social History Social History   Tobacco Use  . Smoking status: Never Smoker  . Smokeless tobacco: Never Used  Substance Use Topics  . Alcohol use: No  . Drug use: No     Allergies   Latex   Review of Systems Review of Systems  All other systems reviewed and are negative.    Physical Exam Updated Vital Signs BP 119/73   Pulse 73   Temp (!) 97.3 F (36.3 C) (Oral)   LMP 10/21/2017   SpO2 100%   Physical Exam  Constitutional: She appears  well-developed and well-nourished.  Uncomfortable appearing  HENT:  Head: Normocephalic and atraumatic.  Mouth/Throat: Oropharynx is clear and moist. No oropharyngeal exudate.  Mucous membranes appear normal  Eyes: Pupils are equal, round, and reactive to light. Conjunctivae and EOM are normal. Right eye exhibits no discharge. Left eye exhibits no discharge. No scleral icterus.  No scleral icterus or jaundice  Neck: Normal range of motion. Neck supple. No JVD present. No thyromegaly present.  Cardiovascular: Normal rate, regular rhythm, normal heart sounds and intact distal pulses. Exam reveals no gallop and no friction rub.  No murmur heard. Heart rate in the 80s, normal pulses at the radial arteries, no edema or JVD  Pulmonary/Chest: Effort normal and breath sounds normal. No respiratory distress. She has  no wheezes. She has no rales.  Lungs are clear to auscultation bilaterally  Abdominal: Soft. Bowel sounds are normal. She exhibits no distension and no mass. There is tenderness.  Minimal tenderness on the abdominal exam mostly in the left lower quadrant, no guarding, no pain at McBurney's point, no right upper quadrant tenderness  Musculoskeletal: Normal range of motion. She exhibits no edema or tenderness.  Lymphadenopathy:    She has no cervical adenopathy.  Neurological: She is alert. Coordination normal.  Skin: Skin is warm and dry. No rash noted. No erythema.  Psychiatric: She has a normal mood and affect. Her behavior is normal.  Nursing note and vitals reviewed.    ED Treatments / Results  Labs (all labs ordered are listed, but only abnormal results are displayed) Labs Reviewed  URINALYSIS, ROUTINE W REFLEX MICROSCOPIC - Abnormal; Notable for the following components:      Result Value   APPearance CLOUDY (*)    Hgb urine dipstick LARGE (*)    Bilirubin Urine SMALL (*)    Ketones, ur 15 (*)    Protein, ur 100 (*)    All other components within normal limits  URINALYSIS, MICROSCOPIC (REFLEX) - Abnormal; Notable for the following components:   Bacteria, UA MANY (*)    All other components within normal limits  I-STAT CHEM 8, ED - Abnormal; Notable for the following components:   Glucose, Bld 164 (*)    Calcium, Ion 1.11 (*)    TCO2 21 (*)    All other components within normal limits  CBC WITH DIFFERENTIAL/PLATELET  I-STAT BETA HCG BLOOD, ED ([facility], [facility], [facility] ONLY)    EKG None  Radiology US Transvaginal Non-ob  Result Date: 10/22/2017 CLINICAL DATA:  Acute pelvic pain EXAM: TRANSABDOMINAL AND TRANSVAGINAL ULTRASOUND OF PELVIS DOPPLER ULTRASOUND OF OVARIES TECHNIQUE: Both transabdominal and transvaginal ultrasound examinations of the pelvis were performed. Transabdominal technique was performed for global imaging of the pelvis including uterus, ovaries, adnexal regions, and  pelvic cul-de-sac. It was necessary to proceed with endovaginal exam following the transabdominal exam to visualize the adnexae in better detail. Color and duplex Doppler ultrasound was utilized to evaluate blood flow to the ovaries. COMPARISON:  02/12/2006 FINDINGS: Uterus Measurements: 6.9 x 3.2 x 4.1 cm. No fibroids or other mass visualized. Endometrium Thickness: 2 mm.  No focal abnormality visualized. Right ovary Unable to visualize the right ovary because of bowel gas Left ovary Measurements: 1.6 x 0.9 x 0.9 cm. Normal appearance/no adnexal mass. Pulsed Doppler evaluation of both ovaries demonstrates normal low-resistance arterial and venous waveforms. Other findings Trace right adnexal fluid IMPRESSION: Nonvisualization of the right ovary secondary to bowel gas. Otherwise no acute finding by pelvic ultrasound. Electronically Signed   By: Jerilynn Mages.  Shick M.D.   On: 10/22/2017 08:59   US Pelvis Complete  Result Date: 10/22/2017 CLINICAL DATA:  Acute pelvic pain EXAM: TRANSABDOMINAL AND TRANSVAGINAL ULTRASOUND OF PELVIS DOPPLER ULTRASOUND OF OVARIES TECHNIQUE: Both transabdominal and transvaginal ultrasound examinations of the pelvis were performed. Transabdominal technique was performed for global imaging of the pelvis including uterus, ovaries, adnexal regions, and pelvic cul-de-sac. It was necessary to proceed with endovaginal exam following the transabdominal exam to visualize the adnexae in better detail. Color and duplex Doppler ultrasound was utilized to evaluate blood flow to the ovaries. COMPARISON:  02/12/2006 FINDINGS: Uterus Measurements: 6.9 x 3.2 x 4.1 cm. No fibroids or other mass visualized. Endometrium Thickness: 2 mm.  No focal abnormality visualized. Right ovary Unable to visualize the right ovary because of bowel gas Left ovary Measurements: 1.6 x 0.9 x 0.9 cm. Normal appearance/no adnexal mass. Pulsed Doppler evaluation of both ovaries demonstrates normal low-resistance arterial and venous  waveforms. Other findings Trace right adnexal fluid IMPRESSION: Nonvisualization of the right ovary secondary to bowel gas. Otherwise no acute finding by pelvic ultrasound. Electronically Signed   By: Jerilynn Mages.  Shick M.D.   On: 10/22/2017 08:59   [place] Art/ven Flow Abd Pelv Doppler  Result Date: 10/22/2017 CLINICAL DATA:  Acute pelvic pain EXAM: TRANSABDOMINAL AND TRANSVAGINAL ULTRASOUND OF PELVIS DOPPLER ULTRASOUND OF OVARIES TECHNIQUE: Both transabdominal and transvaginal ultrasound examinations of the pelvis were performed. Transabdominal technique was performed for global imaging of the pelvis including uterus, ovaries, adnexal regions, and pelvic cul-de-sac. It was necessary to proceed with endovaginal exam following the transabdominal exam to visualize the adnexae in better detail. Color and duplex Doppler ultrasound was utilized to evaluate blood flow to the ovaries. COMPARISON:  02/12/2006 FINDINGS: Uterus Measurements: 6.9 x 3.2 x 4.1 cm. No fibroids or other mass visualized. Endometrium Thickness: 2 mm.  No focal abnormality visualized. Right ovary Unable to visualize the right ovary because of bowel gas Left ovary Measurements: 1.6 x 0.9 x 0.9 cm. Normal appearance/no adnexal mass. Pulsed Doppler evaluation of both ovaries demonstrates normal low-resistance arterial and venous waveforms. Other findings Trace right adnexal fluid IMPRESSION: Nonvisualization of the right ovary secondary to bowel gas. Otherwise no acute finding by pelvic ultrasound. Electronically Signed   By: Jerilynn Mages.  Shick M.D.   On: 10/22/2017 08:59    Procedures Procedures (including critical care time)  Medications Ordered in ED Medications  ketorolac (TORADOL) 30 MG/ML injection 30 mg (30 mg Intramuscular Given 10/22/17 0733)     Initial Impression / Assessment and Plan / ED Course  I have reviewed the triage vital signs and the nursing notes.  Pertinent labs & imaging results that were available during my care of the patient were  reviewed by me and considered in my medical decision making (see chart for details).  Clinical Course as of Oct 23 1214  Fri Oct 22, 2017  1108 The patient is nearly completely resolved after some Toradol, the ultrasound is very reassuring showing no signs of torsion or ovarian abnormalities.  Repeat exam with soft nontender abdomen   [BM]    Clinical Course User Index [BM] Noemi Chapel, MD   Will rule out pregnancy, ectopic pregnancy, ovarian cysts, the patient's exam is significant only for left lower quadrant tenderness which came on quite acutely.  She is tender, doubt kidney stone, urinalysis and ultrasound pending.  Pain medication ordered in the form of anti-inflammatories  Ultrasound is negative, urinalysis with blood but no obvious signs of infection, patient informed, feeling better, stable  for discharge  Final Clinical Impressions(s) / ED Diagnoses   Final diagnoses:  Pelvic pain in female    ED Discharge Orders        Ordered    meloxicam (MOBIC) 7.5 MG tablet  Daily     10/22/17 1215       Noemi Chapel, MD 10/22/17 1216

## 2018-06-16 ENCOUNTER — Other Ambulatory Visit: Payer: Self-pay | Admitting: Obstetrics and Gynecology

## 2018-06-16 DIAGNOSIS — N6310 Unspecified lump in the right breast, unspecified quadrant: Secondary | ICD-10-CM

## 2018-06-20 ENCOUNTER — Other Ambulatory Visit (HOSPITAL_COMMUNITY): Payer: Self-pay | Admitting: *Deleted

## 2018-06-20 DIAGNOSIS — N6310 Unspecified lump in the right breast, unspecified quadrant: Secondary | ICD-10-CM

## 2018-06-29 ENCOUNTER — Telehealth (HOSPITAL_COMMUNITY): Payer: Self-pay | Admitting: *Deleted

## 2018-06-29 NOTE — Telephone Encounter (Signed)
Telephoned patient at home number and left message to return call to [facility] to confirm appointment. Advised would need to speak to her prior to appointment otherwise would have to cancel.

## 2018-06-29 NOTE — Telephone Encounter (Signed)
Telephoned patient at home number and confirmed [facility] appointment for April 2. No symptoms of COVID-19. Patient has not been in contact with someone with a confirmed diagnosis of COVID-19. No travel outside of [place] in 14 days.

## 2018-06-30 ENCOUNTER — Encounter (HOSPITAL_COMMUNITY): Payer: Self-pay

## 2018-06-30 ENCOUNTER — Ambulatory Visit (HOSPITAL_COMMUNITY)
Admission: RE | Admit: 2018-06-30 | Discharge: 2018-06-30 | Disposition: A | Discharge status: Home / Self Care | Payer: Medicaid Other | Source: Ambulatory Visit | Attending: Obstetrics and Gynecology | Admitting: Obstetrics and Gynecology

## 2018-06-30 ENCOUNTER — Ambulatory Visit
Admission: RE | Admit: 2018-06-30 | Discharge: 2018-06-30 | Disposition: A | Discharge status: Home / Self Care | Payer: Medicaid Other | Source: Ambulatory Visit | Attending: Obstetrics and Gynecology | Admitting: Obstetrics and Gynecology

## 2018-06-30 ENCOUNTER — Other Ambulatory Visit: Payer: Self-pay

## 2018-06-30 VITALS — BP 120/78 | Temp 99.6°F | Wt 107.0 lb

## 2018-06-30 DIAGNOSIS — N6310 Unspecified lump in the right breast, unspecified quadrant: Secondary | ICD-10-CM

## 2018-06-30 DIAGNOSIS — Z1239 Encounter for other screening for malignant neoplasm of breast: Secondary | ICD-10-CM

## 2018-06-30 DIAGNOSIS — N644 Mastodynia: Secondary | ICD-10-CM

## 2018-06-30 NOTE — Progress Notes (Signed)
Complaints of two right breast lumps x 6 months that are painful. Patient states the pain comes and goes. Patient rates the pain at a 8 out of 10.   Pap Smear: Pap smear not completed today. Last Pap smear was 06/15/2018 at [facility] per patient and has not received her results. 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 Per patient has had at least three normal Pap smears since abnormal Pap smear. No Pap smear results in EPIC.   Physical exam: Breasts Breasts symmetrical. No skin abnormalities bilateral breasts. No nipple retraction bilateral breasts. No nipple discharge bilateral breasts. No lymphadenopathy. No lumps palpated bilateral breasts. Unable to palpate a lump in patients area of concern. Complaints of right outer breast tenderness on exam. Referred patient to the [facility] for a diagnostic mammogram. Appointment scheduled for Thursday June 30, 2018 at 0920.        Pelvic/Bimanual No Pap smear completed today since last Pap smear was 06/15/2018 per patient. Pap smear not indicated per [facility] guidelines.   Smoking History: Patient has never smoked.  Patient Navigation: Patient education provided. Access to services provided for patient through [facility] program.   Breast and Cervical Cancer Risk Assessment: Patient has a family history of her mother having breast cancer. Patient has no known genetic mutations or history of radiation treatment to the chest before age 56. Patient has no history of cervical dysplasia, immunocompromised, or DES exposure in-utero.  Risk Assessment    Risk Scores      06/30/2018   Last edited by: Armond Hang, LPN   5-year risk: 0.9 %   Lifetime risk: 23.2 %

## 2018-06-30 NOTE — Patient Instructions (Signed)
Explained breast self awareness with Dawn Wade. Patient did not need a Pap smear today due to last Pap smear was 06/15/2018 per patient. Let her know [facility] will cover Pap smears every 3 years unless has a history of abnormal Pap smears. Referred patient to the [facility] for a diagnostic mammogram. Appointment scheduled for Thursday June 30, 2018 at 0920. Patient aware of appointment and will be there. Dawn Wade verbalized understanding.  Ziaire Hagos, Arvil Chaco, RN 8:16 AM

## 2018-08-11 ENCOUNTER — Encounter (HOSPITAL_COMMUNITY): Payer: Self-pay | Admitting: *Deleted

## 2019-06-30 ENCOUNTER — Ambulatory Visit: Payer: Medicaid Other | Attending: Internal Medicine

## 2019-06-30 DIAGNOSIS — Z23 Encounter for immunization: Secondary | ICD-10-CM

## 2019-06-30 NOTE — Progress Notes (Signed)
   Covid-19 Vaccination Clinic  Name:  Dawn Wade    MRN: [id number] DOB: 01-06-1986  06/30/2019  [name] was observed post Covid-19 immunization for 15 minutes without incident. She was provided with Vaccine Information Sheet and instruction to access the V-Safe system.   [name] was instructed to call 911 with any severe reactions post vaccine: Marland Kitchen Difficulty breathing  . Swelling of face and throat  . A fast heartbeat  . A bad rash all over body  . Dizziness and weakness   Immunizations Administered    Name Date Dose VIS Date Route   Pfizer COVID-19 Vaccine 06/30/2019  2:13 PM 0.3 mL 03/10/2019 Intramuscular   Manufacturer: [employer], [employer]   Lot: [id number]   [place]: [id number]

## 2019-07-24 ENCOUNTER — Ambulatory Visit: Payer: Medicaid Other | Attending: Internal Medicine

## 2019-07-24 DIAGNOSIS — Z23 Encounter for immunization: Secondary | ICD-10-CM

## 2019-07-24 NOTE — Progress Notes (Signed)
   Covid-19 Vaccination Clinic  Name:  VERGA DABNEY    MRN: [id number] DOB: [birth year]  07/24/2019  Ms. Macfadden was observed post Covid-19 immunization for 15 minutes without incident. She was provided with Vaccine Information Sheet and instruction to access the V-Safe system.   Ms. Leiding was instructed to call 911 with any severe reactions post vaccine: Marland Kitchen Difficulty breathing  . Swelling of face and throat  . A fast heartbeat  . A bad rash all over body  . Dizziness and weakness   Immunizations Administered    Name Date Dose VIS Date Route   Pfizer COVID-19 Vaccine 07/24/2019  2:36 PM 0.3 mL 05/24/2018 Intramuscular   Manufacturer: [place]   Lot: [id number]   [place]: [id number]

## 2020-09-14 IMAGING — MG DIGITAL DIAGNOSTIC BILATERAL MAMMOGRAM WITH TOMO AND CAD
6 of 10 series · 6 of 30 positions shown · non-contrast
Comparison: Previous exam(s).

CLINICAL DATA: 33-year-old female status post excisional biopsy of
the right breast in 8809 presents with fluctuating tenderness/lump
in the upper outer right breast.

EXAM:
DIGITAL DIAGNOSTIC BILATERAL MAMMOGRAM WITH CAD AND TOMO
ULTRASOUND RIGHT BREAST

[L CC synth-2D]
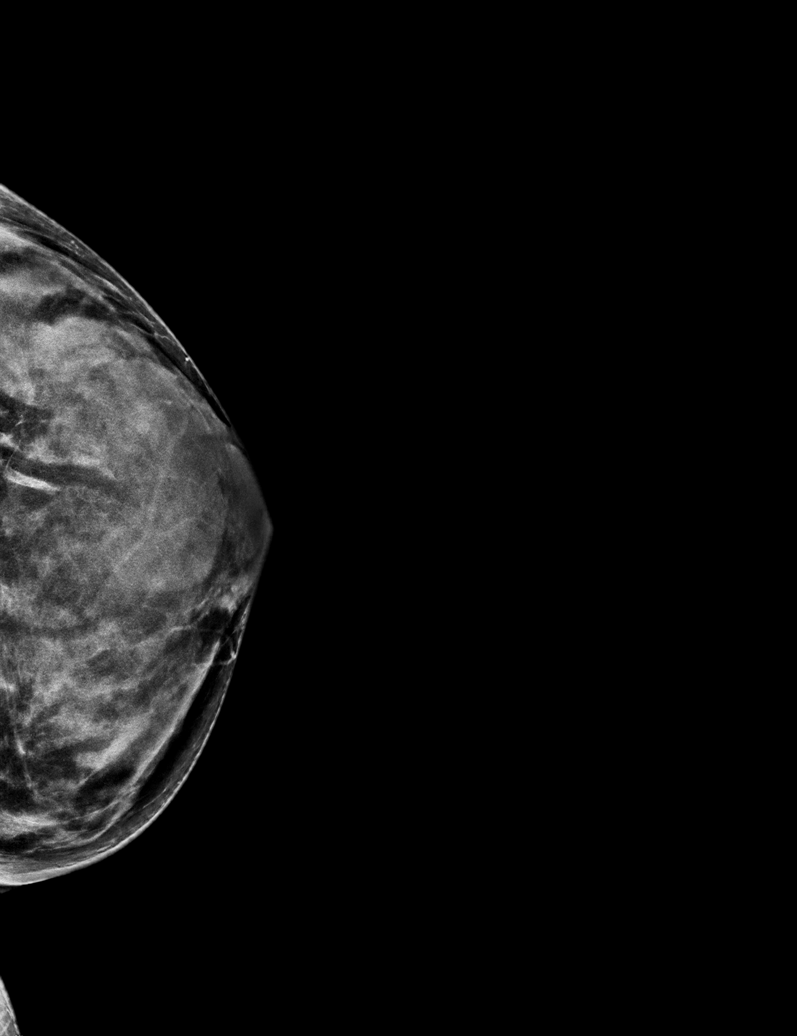

[L MLO synth-2D]
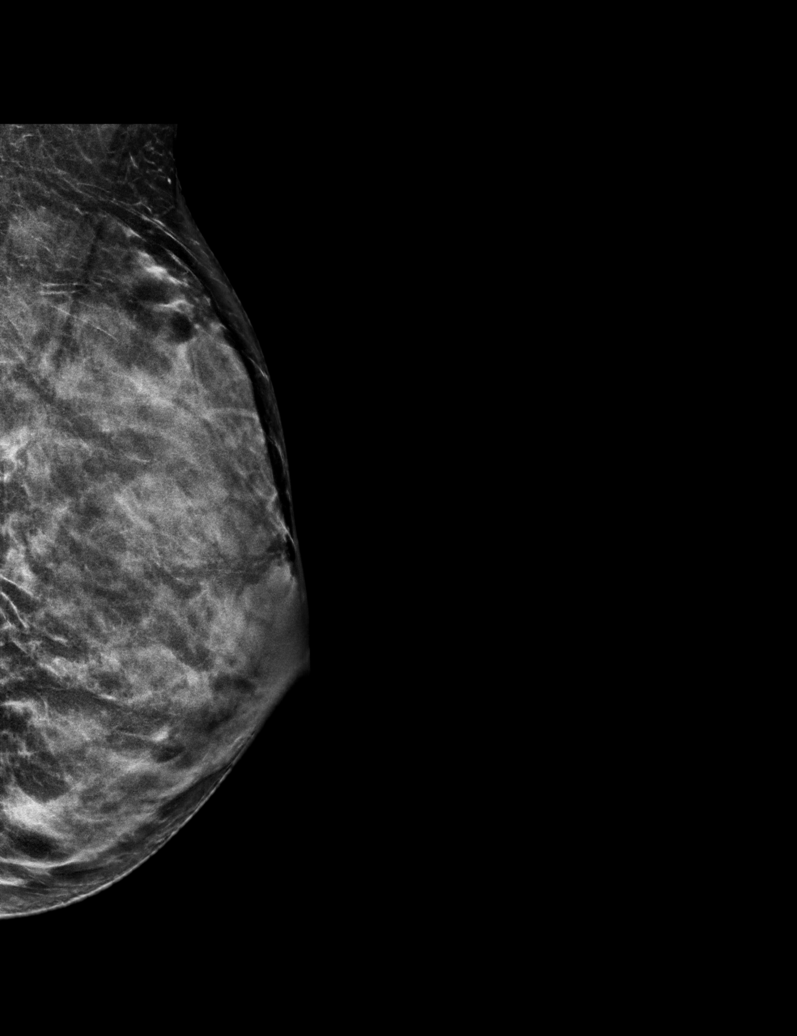

[R CC synth-2D (1 of 2)]
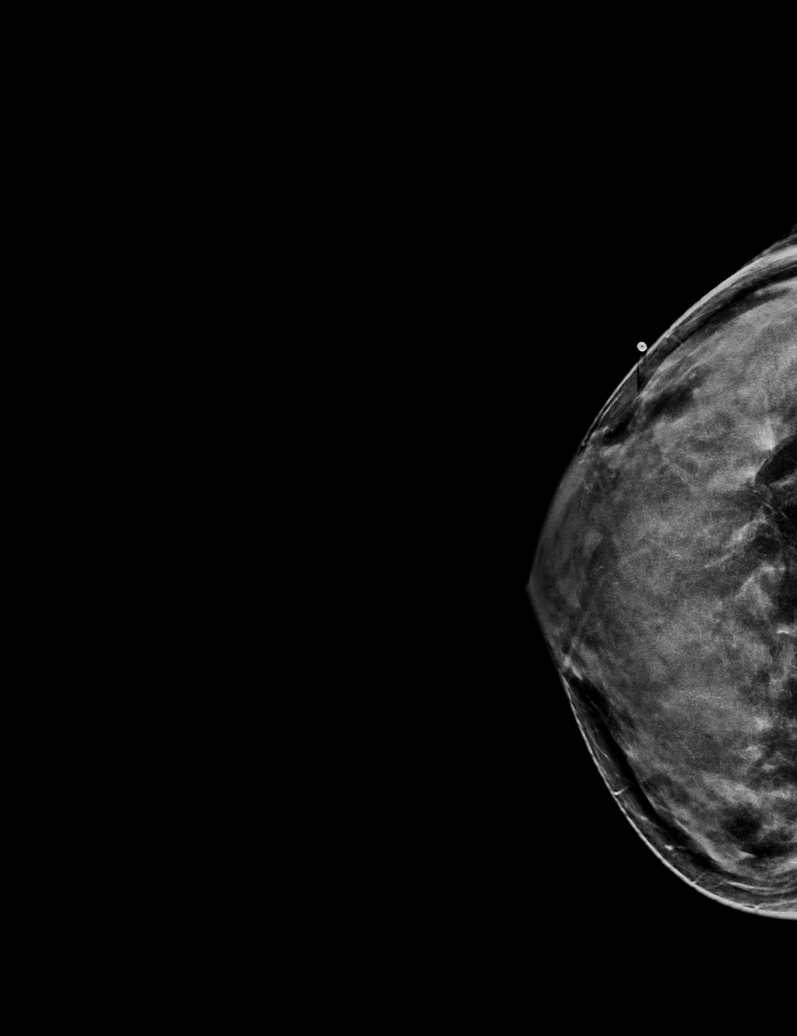

[R MLO synth-2D]
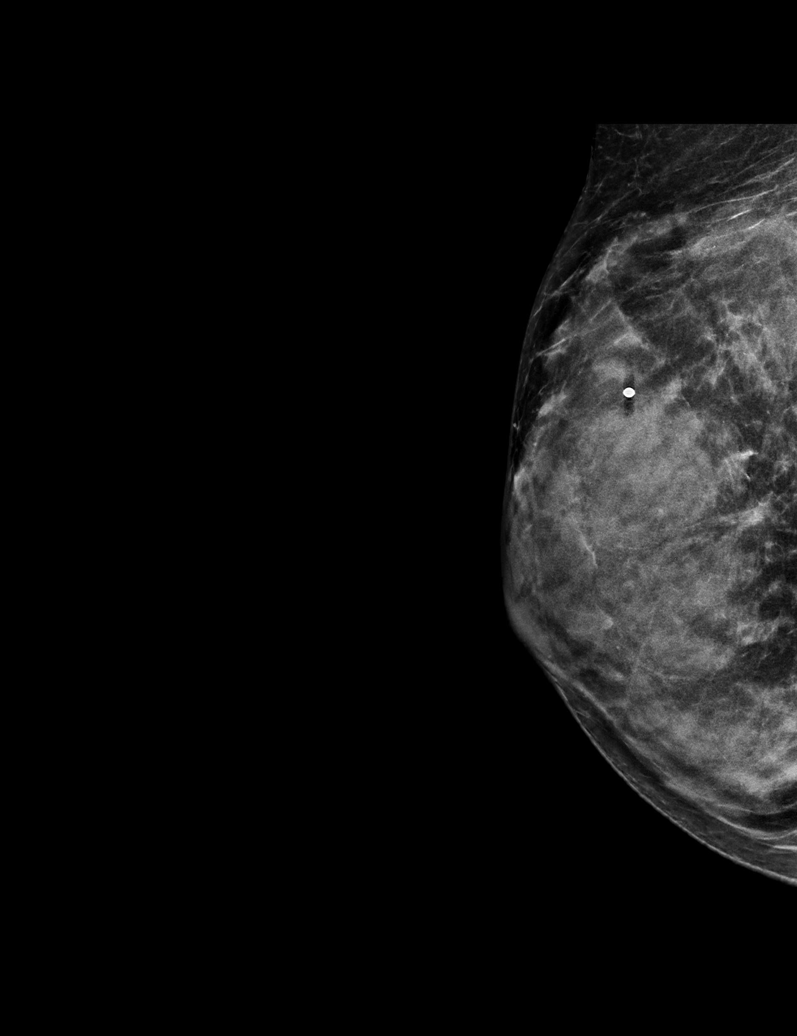

[R CC synth-2D (2 of 2)]
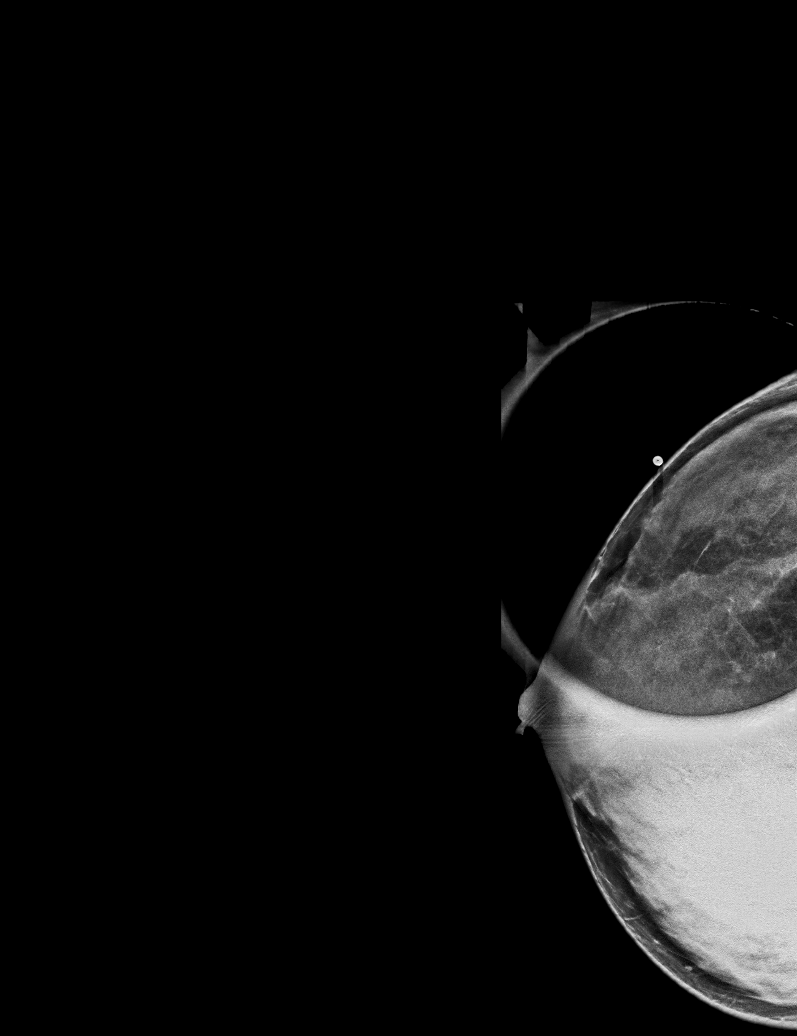

[R MLO tomo · tomo slice 29/56.0]
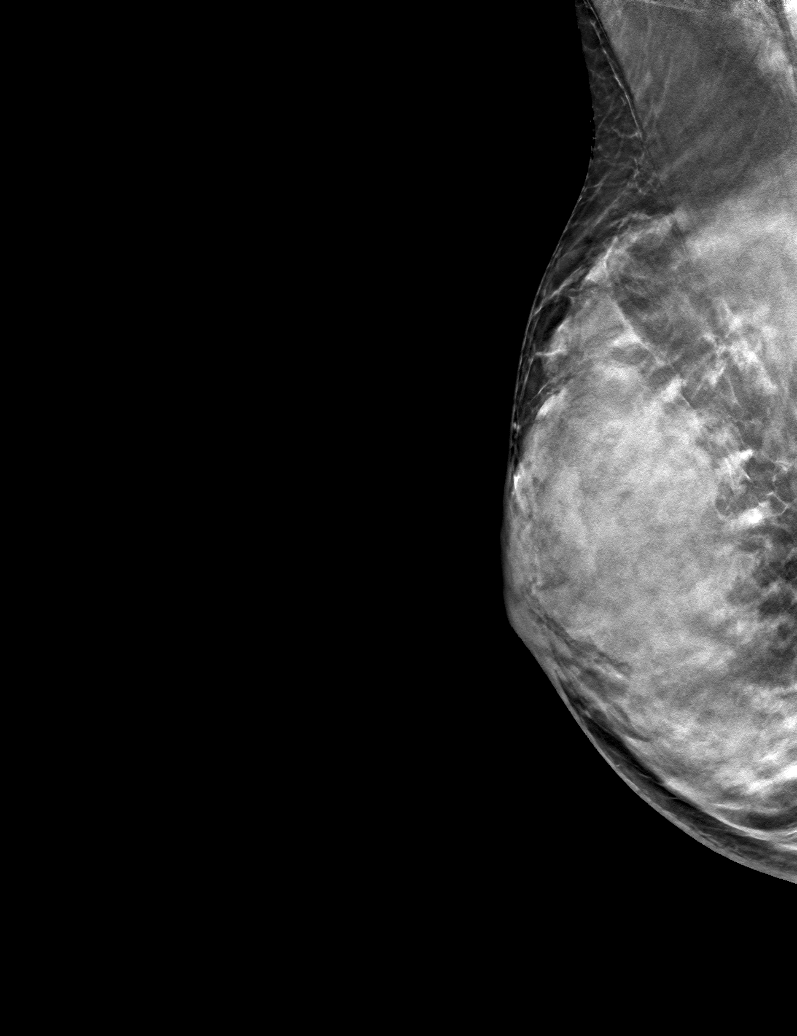

[6 of 30 positions shown; findings below may reference images not displayed]

ACR Breast Density Category d: The breast tissue is extremely dense,
which lowers the sensitivity of mammography.
FINDINGS: A radiopaque BB was placed at the site of the patient's palpable
lump in the upper outer right breast. No focal findings are seen
deep to the radiopaque BB or within the remainder of either breast.

Mammographic images were processed with CAD.

Targeted ultrasound is performed, showing markedly dense
fibroglandular tissue without focal or suspicious sonographic
abnormality. Evaluation of the upper outer right breast was
performed.
IMPRESSION: 1. No mammographic evidence of malignancy in either breast.
2. No focal sonographic findings at the site of the patient's right
breast pain/lump.

RECOMMENDATION:
1. Clinical follow-up recommended for the painful/palpable area of
concern in the right breast. Any further workup should be based on
clinical grounds.
2. Screening mammogram at age 38 unless there are persistent or
intervening clinical concerns. (Code:[id number]-[id number])

I have discussed the findings and recommendations with the patient.
Results were also provided in writing at the conclusion of the
visit. If applicable, a reminder letter will be sent to the patient
regarding the next appointment.

BI-RADS CATEGORY  1: Negative.

## 2021-07-06 ENCOUNTER — Emergency Department (HOSPITAL_BASED_OUTPATIENT_CLINIC_OR_DEPARTMENT_OTHER)
Admission: EM | Admit: 2021-07-06 | Discharge: 2021-07-06 | Disposition: A | Discharge status: Home / Self Care | Payer: Medicaid Other | Attending: Emergency Medicine | Admitting: Emergency Medicine

## 2021-07-06 ENCOUNTER — Other Ambulatory Visit: Payer: Self-pay

## 2021-07-06 ENCOUNTER — Encounter (HOSPITAL_BASED_OUTPATIENT_CLINIC_OR_DEPARTMENT_OTHER): Payer: Self-pay

## 2021-07-06 DIAGNOSIS — Z91040 Latex allergy status: Secondary | ICD-10-CM | POA: Insufficient documentation

## 2021-07-06 DIAGNOSIS — H6692 Otitis media, unspecified, left ear: Secondary | ICD-10-CM | POA: Insufficient documentation

## 2021-07-06 DIAGNOSIS — H6062 Unspecified chronic otitis externa, left ear: Secondary | ICD-10-CM | POA: Insufficient documentation

## 2021-07-06 MED ORDER — AMOXICILLIN-POT CLAVULANATE 875-125 MG PO TABS
1.0000 | ORAL_TABLET | Freq: Once | ORAL | Status: AC
  Administered 2021-07-06: 1 via ORAL
  Filled 2021-07-06: qty 1

## 2021-07-06 MED ORDER — OFLOXACIN 0.3 % OT SOLN: 10.0000 [drp] | Freq: Every day | OTIC | 0 refills | Status: AC

## 2021-07-06 MED ORDER — AMOXICILLIN-POT CLAVULANATE 875-125 MG PO TABS: 1.0000 | ORAL_TABLET | Freq: Two times a day (BID) | ORAL | 0 refills | Status: AC

## 2021-07-06 NOTE — ED Provider Notes (Signed)
?[facility] EMERGENCY DEPT ?Provider Note ? ? ?CSN: [id number] ?Arrival date & time: 07/06/21  1340 ? ?  ? ?History ?Chief Complaint  ?Patient presents with  ? Otalgia  ? ? ?Dawn Wade is a 36 y.o. female otherwise healthy presents emergency department for evaluation of chronic left ear pain since November.  She reports she has been treated multiple times for otitis media and otitis externa since November.  She reports she has been having gradually worsening left ear pain for the past week as well as some otorrhea.  She denies any fevers, nasal congestion, rhinorrhea, sore throat, neck pain, lightheadedness, or headache.  She has been told to follow-up with an ENT before, but has not been able to due to insurance reasons.  She was made an ENT appointment before, but canceled it due to she was feeling better but the pain returned. She does endorse q tip use.  ? ? ?Otalgia ?Associated symptoms: no congestion, no fever, no headaches, no rhinorrhea and no sore throat   ? ?  ? ?Home Medications ?Prior to Admission medications   ?Medication Sig Start Date End Date Taking? Authorizing Provider  ?HYDROcodone-acetaminophen (NORCO/VICODIN) 5-325 MG tablet Take 1-2 tablets by mouth every 6 (six) hours as needed. ?Patient not taking: Reported on 10/22/2017 12/31/14   Montine Circle, PA-C  ?meloxicam (MOBIC) 7.5 MG tablet Take 2 tablets (15 mg total) by mouth daily. ?Patient not taking: Reported on 06/30/2018 10/22/17   Noemi Chapel, MD  ?Lenard Forth Triphasic [facility] TRI-CYCLEN, 28, PO) Take 1 tablet by mouth daily.     [provider]  ?   ? ?Allergies    ?Latex and Sulfa antibiotics   ? ?Review of Systems   ?Review of Systems  ?Constitutional:  Negative for chills and fever.  ?HENT:  Positive for ear pain. Negative for congestion, rhinorrhea and sore throat.   ?Neurological:  Negative for light-headedness and headaches.  ? ?Physical Exam ?Updated Vital Signs ?BP (!) 142/89 (BP Location: Right  Arm)   Pulse 76   Temp 98 ?F (36.7 ?C) (Oral)   Resp 18   Ht '5\' 1"'$  (1.549 m)   Wt 48.5 kg   SpO2 100%   BMI 20.20 kg/m?  ?Physical Exam ?Vitals and nursing note reviewed.  ?Constitutional:   ?   General: She is not in acute distress. ?   Appearance: Normal appearance. She is not toxic-appearing.  ?HENT:  ?   Right Ear: Tympanic membrane, ear canal and external ear normal.  ?   Ears:  ?   Comments: Clear otorrhea noticed in the external ear and canal.  Slightly erythematous canal and swollen.  I can visualize some wet debris in the more proximal portion of the ear canal, I am unable to fully visualize TM.  Unsure if perforation.  No mastoid swelling.  No mastoid tenderness.  No overlying skin changes noted. ?   Nose: Nose normal.  ?   Mouth/Throat:  ?   Mouth: Mucous membranes are moist.  ?   Pharynx: No oropharyngeal exudate or posterior oropharyngeal erythema.  ?Eyes:  ?   General: No scleral icterus. ?Cardiovascular:  ?   Rate and Rhythm: Normal rate.  ?Pulmonary:  ?   Effort: Pulmonary effort is normal. No respiratory distress.  ?Musculoskeletal:  ?   Cervical back: Normal range of motion. No rigidity.  ?Lymphadenopathy:  ?   Cervical: No cervical adenopathy.  ?Skin: ?   General: Skin is dry.  ?   Findings:  No rash.  ?Neurological:  ?   General: No focal deficit present.  ?   Mental Status: She is alert. Mental status is at baseline.  ?Psychiatric:     ?   Mood and Affect: Mood normal.  ? ? ?ED Results / Procedures / Treatments   ?Labs ?(all labs ordered are listed, but only abnormal results are displayed) ?Labs Reviewed - No data to display ? ?EKG ?None ? ?Radiology ?No results found. ? ?Procedures ?Procedures  ? ?Medications Ordered in ED ?Medications  ?amoxicillin-clavulanate (AUGMENTIN) 875-125 MG per tablet 1 tablet (1 tablet Oral Given 07/06/21 1955)  ? ? ?ED Course/ Medical Decision Making/ A&P ?  ?                        ?Medical Decision Making ?Risk ?Prescription drug management. ? ? ?36 year old  female presents emerged department for evaluation of chronic otitis external/media of the left ear.  Differential diagnosis includes was not limited to otitis externa, otitis media, otorrhea, mastoiditis.  Vital signs are stable.  Patient normotensive, afebrile, normal pulse rate, satting well room air without increased work of breathing.  Physical exam is pertinent for clear otorrhea noticed in the external ear and canal.  Slightly erythematous canal and swollen.  I can visualize some wet debris in the more proximal portion of the ear canal, I am unable to fully visualize TM.  Unsure if perforation.  No mastoid swelling.  No mastoid tenderness.  No overlying skin changes noted. ? ?Doubt any mastoiditis given patient's physical exam. ? ?Given the patient has been on multiple antibiotics, consult placed to ENT for possible other treatments. I discussed the case with Dr. Janace Hoard who suggested the typical treatment, but reported that she likely has chronic otitis media that will require ENT evaluation outpatient. I appreciate his consult.  ? ?Given that this is a holiday, will give patient first dose of Augmentin today.  We will send home on Augmentin and ofloxacin.  I stressed the importance of ENT follow-up with the patient.  I have attached the information for Dr. Janace Hoard to the discharge work.  Patient reports she will call to schedule a follow-up appointment.  I discouraged her use of Q-tips.  Strict return precautions were discussed.  Patient verbalized understanding and agrees to plan.  Patient is stable and being discharged home in good condition. ? ? ? ?Final Clinical Impression(s) / ED Diagnoses ?Final diagnoses:  ?Left otitis media, unspecified otitis media type  ?Chronic otitis externa of left ear, unspecified type  ? ? ?Rx / DC Orders ?ED Discharge Orders   ? ?      Ordered  ?  ofloxacin (FLOXIN) 0.3 % OTIC solution  Daily       ? 07/06/21 1940  ?  amoxicillin-clavulanate (AUGMENTIN) 875-125 MG tablet  Every  12 hours       ? 07/06/21 1940  ? ?  ?  ? ?  ? ? ?  ?Sherrell Puller, PA-C ?07/08/21 1344 ? ?  ?Lacretia Leigh, MD ?07/10/21 1553 ? ?

## 2021-07-06 NOTE — Discharge Instructions (Addendum)
You were seen here today for evaluation of your chronic ear infections. I have prescribed you ear drops and an oral medication. Please take as prescribed and complete the entirety of the course. I have discussed your problems with the ENT physician on call who recommends evaluation for specialized treatment. PLEASE STOP USING Q-TIPS. Avoid swimming.  ? ?I have attached two GoodRx coupons to the discharge paperwork for [facility] to try and lower your out of pocket expense.  ?Contact a doctor if: ?You have a fever. ?Your ear is still red, swollen, or painful after 3 days. ?You still have pus coming from your ear after 3 days. ?Your redness, swelling, or pain gets worse. ?You have a very bad headache. ?Get help right away if: ?You have redness, swelling, and pain or tenderness behind your ear. ?

## 2021-07-06 NOTE — ED Notes (Signed)
Discharge paperwork given and understood. 

## 2021-07-06 NOTE — ED Triage Notes (Signed)
Patient here POV from Home with Otalgia. ? ?Endorses Left Ear Pain since November. Patient has been to UC and been on Several Different Medications and some have been helpful for the discomfort but symptoms returned.  ? ?Swelling and Drainage to Same. No Fevers. ? ?NAD Noted during Triage. A&Ox4. GCS 15. Ambulatory.  ?

## 2021-11-07 ENCOUNTER — Other Ambulatory Visit: Payer: Self-pay | Admitting: Otolaryngology

## 2023-03-05 ENCOUNTER — Ambulatory Visit (HOSPITAL_COMMUNITY)
Admission: EM | Admit: 2023-03-05 | Discharge: 2023-03-06 | Disposition: A | Discharge status: Home / Self Care | Payer: Medicaid Other | Attending: Nurse Practitioner | Admitting: Nurse Practitioner

## 2023-03-05 DIAGNOSIS — Z560 Unemployment, unspecified: Secondary | ICD-10-CM | POA: Insufficient documentation

## 2023-03-05 DIAGNOSIS — F411 Generalized anxiety disorder: Secondary | ICD-10-CM | POA: Insufficient documentation
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
## 2023-03-06 NOTE — Discharge Instructions (Signed)
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
## 2023-04-02 ENCOUNTER — Ambulatory Visit (INDEPENDENT_AMBULATORY_CARE_PROVIDER_SITE_OTHER): Payer: Medicaid Other | Admitting: Licensed Clinical Social Worker

## 2023-04-02 DIAGNOSIS — Z008 Encounter for other general examination: Secondary | ICD-10-CM | POA: Insufficient documentation
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
